# Patient Record
Sex: Female | Born: 1960 | Race: White | Hispanic: No | Marital: Married | Smoking: Never smoker
Health system: Southern US, Community
[De-identification: ages and names within clinical notes are randomized; demographics above are authoritative.]

## PROBLEM LIST (undated history)

## (undated) DIAGNOSIS — K219 Gastro-esophageal reflux disease without esophagitis: Secondary | ICD-10-CM

## (undated) DIAGNOSIS — E039 Hypothyroidism, unspecified: Secondary | ICD-10-CM

## (undated) HISTORY — DX: Hypothyroidism, unspecified: E03.9

## (undated) HISTORY — PX: TONSILLECTOMY: SUR1361

## (undated) HISTORY — DX: Gastro-esophageal reflux disease without esophagitis: K21.9

---

## 1996-04-28 HISTORY — PX: OVARY SURGERY: SHX727

## 1998-05-30 ENCOUNTER — Other Ambulatory Visit: Admission: RE | Admit: 1998-05-30 | Discharge: 1998-05-30 | Payer: Self-pay | Admitting: Gynecology

## 1998-06-08 ENCOUNTER — Encounter: Payer: Self-pay | Admitting: Gynecology

## 1998-06-08 ENCOUNTER — Ambulatory Visit (HOSPITAL_COMMUNITY): Admission: RE | Admit: 1998-06-08 | Discharge: 1998-06-08 | Payer: Self-pay | Admitting: Gynecology

## 1998-06-19 ENCOUNTER — Ambulatory Visit (HOSPITAL_COMMUNITY): Admission: RE | Admit: 1998-06-19 | Discharge: 1998-06-19 | Payer: Self-pay | Admitting: Gastroenterology

## 1998-06-19 ENCOUNTER — Encounter: Payer: Self-pay | Admitting: Gastroenterology

## 1999-06-03 ENCOUNTER — Other Ambulatory Visit: Admission: RE | Admit: 1999-06-03 | Discharge: 1999-06-03 | Payer: Self-pay | Admitting: Gynecology

## 1999-06-13 ENCOUNTER — Encounter: Payer: Self-pay | Admitting: Gynecology

## 1999-06-13 ENCOUNTER — Encounter: Admission: RE | Admit: 1999-06-13 | Discharge: 1999-06-13 | Payer: Self-pay | Admitting: Gynecology

## 1999-06-18 ENCOUNTER — Encounter: Payer: Self-pay | Admitting: Gynecology

## 1999-06-18 ENCOUNTER — Encounter: Admission: RE | Admit: 1999-06-18 | Discharge: 1999-06-18 | Payer: Self-pay | Admitting: Gynecology

## 1999-11-27 ENCOUNTER — Other Ambulatory Visit: Admission: RE | Admit: 1999-11-27 | Discharge: 1999-11-27 | Payer: Self-pay | Admitting: Gynecology

## 2000-01-02 ENCOUNTER — Encounter: Payer: Self-pay | Admitting: Gastroenterology

## 2000-01-02 ENCOUNTER — Ambulatory Visit (HOSPITAL_COMMUNITY): Admission: RE | Admit: 2000-01-02 | Discharge: 2000-01-02 | Payer: Self-pay | Admitting: Gastroenterology

## 2000-06-01 ENCOUNTER — Other Ambulatory Visit: Admission: RE | Admit: 2000-06-01 | Discharge: 2000-06-01 | Payer: Self-pay | Admitting: Gynecology

## 2000-06-18 ENCOUNTER — Encounter: Payer: Self-pay | Admitting: Gynecology

## 2000-06-18 ENCOUNTER — Encounter: Admission: RE | Admit: 2000-06-18 | Discharge: 2000-06-18 | Payer: Self-pay | Admitting: Gynecology

## 2000-12-21 ENCOUNTER — Other Ambulatory Visit: Admission: RE | Admit: 2000-12-21 | Discharge: 2000-12-21 | Payer: Self-pay | Admitting: Gynecology

## 2001-03-15 ENCOUNTER — Ambulatory Visit (HOSPITAL_COMMUNITY): Admission: RE | Admit: 2001-03-15 | Discharge: 2001-03-15 | Payer: Self-pay | Admitting: Family Medicine

## 2001-03-15 ENCOUNTER — Encounter: Payer: Self-pay | Admitting: Family Medicine

## 2001-05-18 ENCOUNTER — Ambulatory Visit (HOSPITAL_COMMUNITY): Admission: RE | Admit: 2001-05-18 | Discharge: 2001-05-18 | Payer: Self-pay | Admitting: Gastroenterology

## 2001-11-24 ENCOUNTER — Encounter: Admission: RE | Admit: 2001-11-24 | Discharge: 2001-11-24 | Payer: Self-pay | Admitting: Oncology

## 2001-11-24 ENCOUNTER — Encounter (HOSPITAL_COMMUNITY): Admission: RE | Admit: 2001-11-24 | Discharge: 2001-12-24 | Payer: Self-pay | Admitting: Oncology

## 2001-12-06 ENCOUNTER — Other Ambulatory Visit: Admission: RE | Admit: 2001-12-06 | Discharge: 2001-12-06 | Payer: Self-pay | Admitting: Gynecology

## 2001-12-30 ENCOUNTER — Encounter (HOSPITAL_COMMUNITY): Admission: RE | Admit: 2001-12-30 | Discharge: 2002-01-29 | Payer: Self-pay | Admitting: Rheumatology

## 2002-02-24 ENCOUNTER — Encounter (HOSPITAL_COMMUNITY): Admission: RE | Admit: 2002-02-24 | Discharge: 2002-03-26 | Payer: Self-pay | Admitting: Rheumatology

## 2002-03-28 ENCOUNTER — Ambulatory Visit (HOSPITAL_COMMUNITY): Admission: RE | Admit: 2002-03-28 | Discharge: 2002-03-28 | Payer: Self-pay | Admitting: Gastroenterology

## 2002-03-28 ENCOUNTER — Encounter: Payer: Self-pay | Admitting: Gastroenterology

## 2004-02-19 ENCOUNTER — Encounter: Admission: RE | Admit: 2004-02-19 | Discharge: 2004-02-19 | Payer: Self-pay | Admitting: Gynecology

## 2004-06-10 ENCOUNTER — Other Ambulatory Visit: Admission: RE | Admit: 2004-06-10 | Discharge: 2004-06-10 | Payer: Self-pay | Admitting: Gynecology

## 2005-03-03 ENCOUNTER — Encounter: Admission: RE | Admit: 2005-03-03 | Discharge: 2005-03-03 | Payer: Self-pay | Admitting: Gynecology

## 2005-06-16 ENCOUNTER — Other Ambulatory Visit: Admission: RE | Admit: 2005-06-16 | Discharge: 2005-06-16 | Payer: Self-pay | Admitting: Gynecology

## 2005-08-14 ENCOUNTER — Ambulatory Visit (HOSPITAL_COMMUNITY): Admission: RE | Admit: 2005-08-14 | Discharge: 2005-08-14 | Payer: Self-pay | Admitting: Family Medicine

## 2005-10-27 ENCOUNTER — Encounter: Admission: RE | Admit: 2005-10-27 | Discharge: 2005-10-27 | Payer: Self-pay | Admitting: Gastroenterology

## 2005-11-07 ENCOUNTER — Encounter: Admission: RE | Admit: 2005-11-07 | Discharge: 2005-11-07 | Payer: Self-pay | Admitting: Gastroenterology

## 2006-03-16 ENCOUNTER — Encounter: Admission: RE | Admit: 2006-03-16 | Discharge: 2006-03-16 | Payer: Self-pay | Admitting: Gynecology

## 2006-06-22 ENCOUNTER — Other Ambulatory Visit: Admission: RE | Admit: 2006-06-22 | Discharge: 2006-06-22 | Payer: Self-pay | Admitting: Gynecology

## 2007-03-22 ENCOUNTER — Encounter: Admission: RE | Admit: 2007-03-22 | Discharge: 2007-03-22 | Payer: Self-pay | Admitting: Gynecology

## 2007-11-17 ENCOUNTER — Ambulatory Visit (HOSPITAL_COMMUNITY): Admission: RE | Admit: 2007-11-17 | Discharge: 2007-11-17 | Payer: Self-pay | Admitting: Family Medicine

## 2008-03-22 ENCOUNTER — Encounter: Admission: RE | Admit: 2008-03-22 | Discharge: 2008-03-22 | Payer: Self-pay | Admitting: Gynecology

## 2009-03-26 ENCOUNTER — Encounter: Admission: RE | Admit: 2009-03-26 | Discharge: 2009-03-26 | Payer: Self-pay | Admitting: Gynecology

## 2009-04-02 ENCOUNTER — Encounter: Admission: RE | Admit: 2009-04-02 | Discharge: 2009-04-02 | Payer: Self-pay | Admitting: Gynecology

## 2009-04-16 ENCOUNTER — Encounter: Admission: RE | Admit: 2009-04-16 | Discharge: 2009-04-16 | Payer: Self-pay | Admitting: Neurology

## 2010-02-25 ENCOUNTER — Ambulatory Visit (HOSPITAL_COMMUNITY): Admission: RE | Admit: 2010-02-25 | Discharge: 2010-02-25 | Payer: Self-pay | Admitting: Internal Medicine

## 2010-03-27 ENCOUNTER — Encounter: Admission: RE | Admit: 2010-03-27 | Discharge: 2010-03-27 | Payer: Self-pay | Admitting: Gynecology

## 2010-05-19 ENCOUNTER — Encounter: Payer: Self-pay | Admitting: Gynecology

## 2010-07-10 ENCOUNTER — Other Ambulatory Visit (HOSPITAL_COMMUNITY): Payer: Self-pay | Admitting: Family Medicine

## 2010-07-10 DIAGNOSIS — R110 Nausea: Secondary | ICD-10-CM

## 2010-07-10 DIAGNOSIS — M549 Dorsalgia, unspecified: Secondary | ICD-10-CM

## 2010-07-15 ENCOUNTER — Ambulatory Visit (HOSPITAL_COMMUNITY): Payer: BC Managed Care – PPO

## 2010-09-13 NOTE — Consult Note (Signed)
NAME:  Tracie, Ross NO.:  [id number]   MEDICAL RECORD NO.:  [id number]                   PATIENT TYPE:   LOCATION:                                       FACILITY:   PHYSICIAN:  Aundra Dubin, M.D.            DATE OF BIRTH:   DATE OF CONSULTATION:  02/24/2002  DATE OF DISCHARGE:                                   CONSULTATION   CHIEF COMPLAINT:  Left elbow and shoulder, probable fibromyalgia.   BRIEF HISTORY:  Tracie Ross returns, reporting that Tracie Ross is sleeping some  better with the use of Xanax in combination with her Elavil.  Her overall  achiness is about the same, but it is not worse. Tracie Ross is having fairly rare  headaches at this time, and the irritable bowel symptoms are reasonably  controlled.  Her worst area of pain at this time is the left lateral elbow.  This hurts when Tracie Ross lifts items; Tracie Ross is also stiff and hurting with the left  shoulder.  This has been going on for several weeks.   MEDICATIONS:  1. Xanax 0.5 mg h.s.  2. Celebrex 200 mg b.i.d.  3. Nexium 40 mg q.d.  4. Elavil 50 mg h.s.  5. Claritin-D q.d.  6. Fioricet p.r.n.  7. Singulair q.d.   PHYSICAL EXAMINATION:  VITAL SIGNS:  Weight 127 lbs.  Blood pressure 100/70,  respirations 16.  GENERAL:  Tracie Ross appears well.  LUNGS:  Clear.  HEART:  Regular.  LOWER EXTREMITIES:  No edema.  MUSCULOSKELETAL:  There is no swelling to the hands or wrists.  Tracie Ross is quite  tender at the left lateral epicondyle; resisted maneuvers make this worse.  The left shoulder has a good range of motion but is mildly tender.  Tracie Ross  could abduct easily to 150 degrees or beyond.  Trigger points around the  shoulder, neck, ____________ anterior chest remains mildly tender.   ASSESSMENT:  1. Fibromyalgia.  Tracie Ross seems to be overall stable with this problem.  Her     pain is not accelerated, and Tracie Ross is sleeping some better.  I believe this     is a problem that is stable, and I can return her care to Dr.  Sherwood Gambler.  2. Left lateral epicondylitis.  I have explained to her how Tracie Ross will ice the     elbow once a day for 15 minutes for a one-week period.  Then, Tracie Ross will     ice it every other day for about two weeks, further.  Tracie Ross will put the     wrist and elbow through a range of motion.  Tracie Ross is already taking     Celebrex, and I have given her a prescription for a     tennis elbow strap.  If this is not helping, then I would consider a     Cortisone injection.  Tracie Ross will also try to avoid aggravating activities.  PLAN:  Tracie Ross will return on a p.r.n. basis.                                               Aundra Dubin, M.D.    WWT/MEDQ  D:  02/24/2002  T:  02/24/2002  Job:  [id number]   cc:   Madelin Rear. Sherwood Gambler, M.D.  [address]  [place]  [place] [address]  Fax: [phone]

## 2010-09-13 NOTE — Consult Note (Signed)
NAME:  Tracie Ross, Tracie Ross NO.:  [id number]   MEDICAL RECORD NO.:  [id number]                  PATIENT TYPE:   LOCATION:                                       FACILITY:   PHYSICIAN:  Aundra Dubin, M.D.            DATE OF BIRTH:   DATE OF CONSULTATION:  12/30/2001  DATE OF DISCHARGE:                                   CONSULTATION   Thank you for this consultation.  The patient is a 50 year old white female  who is here for an evaluation of possible fibromyalgia.  She has recently  been evaluated by Dr. Mariel Sleet because of easy bruisability which has been  suspected to be related to Celebrex.  For about two and a half years she has  had some wide spread pain.  She reports having a dull ache all over.  She  also describes this as flu like achiness.  Her elbows ache and are swollen.  She hurts at the tops of the shoulders.  Her neck will hurt and this causes  a headache.  When she wakes up in the morning she feels unrested, tired, and  sore.  She feels as if she has run a marathon.  Her energy level is quite  low.  She has had an alternating pattern of diarrhea and constipation for  several years and this has been diagnosed as irritable bowel syndrome.   On further review of systems there has been no fever or significant rashes  other than the bruising.  She reports having increase in weight by about 25  pounds over a year.  She does not feel that the Elavil is greatly involved  with this as she has been on it for about two months.  There has been no  photosensitivity, oral ulcers, alopecia, or pleurisy.  She states cold  fairly frequently and she will ache at night because of this.  There has  been no blanching of her fingers to suggest Raynaud's.  She has some back  pain.  There has been no chest pain or shortness of breath.   PAST MEDICAL HISTORY:  History of fibromyalgia, GERD, tonsillectomy, left  inguinal hernia repair 1964, vaginal pelvic  surgery 1982, colonoscopy 2001,  endoscopy 1998 and 2003, irritable bowel syndrome.   MEDICATIONS:  1. Celebrex 200 mg b.i.d.  2. Nexium 40 mg q.d.  3. Elavil 50 mg h.s.  4. Xanax presently not taking.  5. Claritin D p.r.n.  6. Singulair q.d.  7. Fioricet p.r.n. for headaches.   ALLERGIES:  Intolerances:  ASPIRIN, CEFTIN, CIPRO.   FAMILY HISTORY:  Her father is 7 years of age.  He has had a stroke and has  HTN.  Her mother is 40 years of age and has breast cancer.  She is doing  well.   SOCIAL HISTORY:  She grew up in [place] and presently lives in [place] with her  husband.  She does  not have children.  She is not working at this time.  She  never smoked and does not drink alcohol.   PHYSICAL EXAMINATION:  VITAL SIGNS:  Weight 126 pounds, blood pressure  110/68, respirations 14.  GENERAL:  She is appropriate weight and is in no distress.  SKIN:  I find no significant bruising at this time or other rashes.  HEENT:  Normal hair pattern.  PERRL/EOMI.  Mouth clear.  NECK:  Moderate increased thyroid that is symmetric and nontender.  LUNGS:  Clear.  HEART:  Regular.  No murmur.  ABDOMEN:  Negative  HSM with diffuse mild tenderness.  MUSCULOSKELETAL:  The hands, wrists, elbows, shoulders, neck, back, hips,  knees, ankles, and feet all have a painless full range of motion and show no  synovitis.  Trigger points at the elbows, shoulder, neck, occiput, anterior  chest, and along the paraspinous muscles to the low back are tender with  complaint of pain and some moderate wincing.  NEUROLOGIC:  Strength is 5/5.  DTRs are 2+ throughout.  Negative SLRs.   ASSESSMENT/PLAN:  1. Likely fibromyalgia type syndrome.  She has the cardinal symptoms of     nonrestorative sleep, fatigue, overall aching, pain, irritable bowel     syndrome, and headaches.  I have discussed these symptoms with her.  We     will try and improve her sleep by adding Xanax 0.5 mg to her Elavil.  If     she continues to  gain weight, then we may want to substitute something     for the Elavil.  I discussed with her that she is at appropriate weight     at this point but I am hopeful that she will not continue gaining 1 and 2     pounds each month.  I have also recommended exercise for her.  She says     her husband is quite active and backpacks.  She is interested in     bicycling which would be fine.  I have encouraged her to start with a     small amount with a goal over six weeks to be able to exercise for 30-40     minutes three times a week.  Spent extra time discussing this with her.  2. Irritable bowel syndrome.  3. Recent easy bruising.  It seems to be improved at this time but she is     going to take the Celebrex for a while and see if it comes back.   Peyton Najjar, as above, I believe the patient meets the criteria for a fibromyalgia  type syndrome.  I am hopeful we can improve her sleep with the medicines.  I  also believe that if she will start exercising this will also increase her  energy level and hopefully get her feeling better.  I will see her back in  two months.                                               Aundra Dubin, M.D.    WWT/MEDQ  D:  12/30/2001  T:  12/31/2001  Job:  [id number]   cc:   Madelin Rear. Sherwood Gambler, M.D.  [address]  [place]  [place] [address]  Fax: [phone]

## 2010-09-13 NOTE — Procedures (Signed)
[facility]  Patient:    Tracie Ross, Tracie Ross Visit Number: [id number] MRN: [id number]          Service Type: END Location: ENDO Attending Physician:  Louie Bun Dictated by:   Everardo All Madilyn Fireman, M.D. Proc. Date: 05/18/01 Admit Date:  05/18/2001                             Procedure Report  PROCEDURE:  Esophagogastroduodenoscopy with esophageal dilatation.  INDICATIONS FOR PROCEDURE:  Recurrent solid food dysphagia with good response to esophageal dilatation in the past.  DESCRIPTION OF PROCEDURE:  The patient was placed in the left lateral decubitus position then placed on the pulse monitor with continuous low flow oxygen delivered by nasal cannula. She was sedated with 70 mg IV Demerol and 7 mg IV Versed. The Olympus video endoscope was advanced under direct vision into the oropharynx and esophagus. The esophagus was straight and of normal caliber at the squamocolumnar line at 38 cm. There was no visible esophagitis, ring, stricture, hiatal hernia or other abnormality of the GE junction or distal esophagus. The stomach was entered and a small amount of liquid secretions were suctioned from the fundus. Retroflexed view of the cardia was unremarkable. The fundus, body, antrum, and pylorus all appeared normal. The duodenum was entered and both the bulb and second portion were well inspected and appeared to be within normal limits. A Savary guidewire was placed through the endoscope channel and the scope withdrawn. A single 16 mm Savary dilator was passed over the guidewire with mild resistance and no blood seen on withdrawal. The dilator was removed together with the wire and the patient returned to the recovery room in stable condition. The patient tolerated the procedure well and there were no immediate complications.  IMPRESSION:  Dysphagia status post empiric Savary dilatation with normal findings on endoscopy.  PLAN:  Will advance diet and  observe response to dilatation. Dictated by:   Everardo All Madilyn Fireman, M.D. Attending Physician:  Louie Bun DD:  05/18/01 TD:  05/19/01 Job: [id number]

## 2011-02-17 ENCOUNTER — Other Ambulatory Visit: Payer: Self-pay | Admitting: Gynecology

## 2011-02-17 DIAGNOSIS — Z1231 Encounter for screening mammogram for malignant neoplasm of breast: Secondary | ICD-10-CM

## 2011-03-31 ENCOUNTER — Ambulatory Visit
Admission: RE | Admit: 2011-03-31 | Discharge: 2011-03-31 | Disposition: A | Discharge status: Home / Self Care | Payer: BC Managed Care – PPO | Source: Ambulatory Visit | Attending: Gynecology | Admitting: Gynecology

## 2011-03-31 DIAGNOSIS — Z1231 Encounter for screening mammogram for malignant neoplasm of breast: Secondary | ICD-10-CM

## 2012-02-24 ENCOUNTER — Other Ambulatory Visit: Payer: Self-pay | Admitting: Gynecology

## 2012-02-24 DIAGNOSIS — Z1231 Encounter for screening mammogram for malignant neoplasm of breast: Secondary | ICD-10-CM

## 2012-04-05 ENCOUNTER — Ambulatory Visit
Admission: RE | Admit: 2012-04-05 | Discharge: 2012-04-05 | Disposition: A | Discharge status: Home / Self Care | Payer: BC Managed Care – PPO | Source: Ambulatory Visit | Attending: Gynecology | Admitting: Gynecology

## 2012-04-05 DIAGNOSIS — Z1231 Encounter for screening mammogram for malignant neoplasm of breast: Secondary | ICD-10-CM

## 2013-03-07 ENCOUNTER — Other Ambulatory Visit: Payer: Self-pay

## 2013-03-07 DIAGNOSIS — Z1231 Encounter for screening mammogram for malignant neoplasm of breast: Secondary | ICD-10-CM

## 2013-04-11 ENCOUNTER — Ambulatory Visit: Payer: BC Managed Care – PPO

## 2013-04-22 ENCOUNTER — Ambulatory Visit
Admission: RE | Admit: 2013-04-22 | Discharge: 2013-04-22 | Disposition: A | Discharge status: Home / Self Care | Payer: BC Managed Care – PPO | Source: Ambulatory Visit

## 2013-04-22 DIAGNOSIS — Z1231 Encounter for screening mammogram for malignant neoplasm of breast: Secondary | ICD-10-CM

## 2014-03-02 ENCOUNTER — Other Ambulatory Visit: Payer: Self-pay

## 2014-03-02 DIAGNOSIS — Z1231 Encounter for screening mammogram for malignant neoplasm of breast: Secondary | ICD-10-CM

## 2014-04-24 ENCOUNTER — Encounter (INDEPENDENT_AMBULATORY_CARE_PROVIDER_SITE_OTHER): Payer: Self-pay

## 2014-04-24 ENCOUNTER — Ambulatory Visit
Admission: RE | Admit: 2014-04-24 | Discharge: 2014-04-24 | Disposition: A | Discharge status: Home / Self Care | Payer: BC Managed Care – PPO | Source: Ambulatory Visit

## 2014-04-24 DIAGNOSIS — Z1231 Encounter for screening mammogram for malignant neoplasm of breast: Secondary | ICD-10-CM

## 2014-12-26 ENCOUNTER — Ambulatory Visit (HOSPITAL_COMMUNITY)
Admission: RE | Admit: 2014-12-26 | Discharge: 2014-12-26 | Disposition: A | Discharge status: Home / Self Care | Payer: BLUE CROSS/BLUE SHIELD | Source: Ambulatory Visit | Attending: Family Medicine | Admitting: Family Medicine

## 2014-12-26 ENCOUNTER — Other Ambulatory Visit (HOSPITAL_COMMUNITY): Payer: Self-pay | Admitting: Family Medicine

## 2014-12-26 DIAGNOSIS — M5031 Other cervical disc degeneration,  high cervical region: Secondary | ICD-10-CM | POA: Diagnosis not present

## 2014-12-26 DIAGNOSIS — R51 Headache: Secondary | ICD-10-CM | POA: Diagnosis not present

## 2014-12-26 DIAGNOSIS — M79601 Pain in right arm: Secondary | ICD-10-CM | POA: Diagnosis present

## 2014-12-26 DIAGNOSIS — M8938 Hypertrophy of bone, other site: Secondary | ICD-10-CM | POA: Insufficient documentation

## 2014-12-26 DIAGNOSIS — R202 Paresthesia of skin: Secondary | ICD-10-CM | POA: Insufficient documentation

## 2014-12-26 DIAGNOSIS — M542 Cervicalgia: Secondary | ICD-10-CM

## 2015-01-17 ENCOUNTER — Other Ambulatory Visit (HOSPITAL_COMMUNITY): Payer: Self-pay | Admitting: Family Medicine

## 2015-01-17 DIAGNOSIS — M5410 Radiculopathy, site unspecified: Secondary | ICD-10-CM

## 2015-01-17 DIAGNOSIS — M542 Cervicalgia: Secondary | ICD-10-CM

## 2015-01-17 DIAGNOSIS — G5691 Unspecified mononeuropathy of right upper limb: Secondary | ICD-10-CM

## 2015-01-29 ENCOUNTER — Ambulatory Visit (HOSPITAL_COMMUNITY)
Admission: RE | Admit: 2015-01-29 | Discharge: 2015-01-29 | Disposition: A | Discharge status: Home / Self Care | Payer: BLUE CROSS/BLUE SHIELD | Source: Ambulatory Visit | Attending: Family Medicine | Admitting: Family Medicine

## 2015-01-29 DIAGNOSIS — M542 Cervicalgia: Secondary | ICD-10-CM

## 2015-01-29 DIAGNOSIS — M47892 Other spondylosis, cervical region: Secondary | ICD-10-CM | POA: Insufficient documentation

## 2015-01-29 DIAGNOSIS — G5691 Unspecified mononeuropathy of right upper limb: Secondary | ICD-10-CM

## 2015-01-29 DIAGNOSIS — M5410 Radiculopathy, site unspecified: Secondary | ICD-10-CM

## 2015-01-29 DIAGNOSIS — R200 Anesthesia of skin: Secondary | ICD-10-CM | POA: Diagnosis not present

## 2015-04-03 ENCOUNTER — Other Ambulatory Visit: Payer: Self-pay

## 2015-04-03 DIAGNOSIS — Z1231 Encounter for screening mammogram for malignant neoplasm of breast: Secondary | ICD-10-CM

## 2015-05-01 ENCOUNTER — Ambulatory Visit: Payer: BLUE CROSS/BLUE SHIELD

## 2015-05-04 ENCOUNTER — Ambulatory Visit
Admission: RE | Admit: 2015-05-04 | Discharge: 2015-05-04 | Disposition: A | Discharge status: Home / Self Care | Payer: BLUE CROSS/BLUE SHIELD | Source: Ambulatory Visit

## 2015-05-04 DIAGNOSIS — Z1231 Encounter for screening mammogram for malignant neoplasm of breast: Secondary | ICD-10-CM

## 2015-09-20 ENCOUNTER — Other Ambulatory Visit: Payer: Self-pay | Admitting: Nurse Practitioner

## 2015-09-20 DIAGNOSIS — M4712 Other spondylosis with myelopathy, cervical region: Secondary | ICD-10-CM

## 2015-09-21 NOTE — Progress Notes (Signed)
Pt chart indicates she had a rash and was treated with 25 mg of benadryl and was fine. Called an cancelled 13 hour prep after talking to pt.

## 2015-10-01 ENCOUNTER — Ambulatory Visit
Admission: RE | Admit: 2015-10-01 | Discharge: 2015-10-01 | Disposition: A | Discharge status: Home / Self Care | Payer: BLUE CROSS/BLUE SHIELD | Source: Ambulatory Visit | Attending: Nurse Practitioner | Admitting: Nurse Practitioner

## 2015-10-01 DIAGNOSIS — M4712 Other spondylosis with myelopathy, cervical region: Secondary | ICD-10-CM

## 2015-10-01 MED ORDER — IOPAMIDOL (ISOVUE-300) INJECTION 61%
1.0000 mL | Freq: Once | INTRAVENOUS | Status: AC | PRN
  Administered 2015-10-01: 1 mL

## 2015-10-01 MED ORDER — TRIAMCINOLONE ACETONIDE 40 MG/ML IJ SUSP (RADIOLOGY)
60.0000 mg | Freq: Once | INTRAMUSCULAR | Status: AC
  Administered 2015-10-01: 60 mg via EPIDURAL

## 2015-10-01 NOTE — Discharge Instructions (Signed)

## 2016-02-08 ENCOUNTER — Other Ambulatory Visit: Payer: Self-pay | Admitting: Neurosurgery

## 2016-02-08 DIAGNOSIS — M4712 Other spondylosis with myelopathy, cervical region: Secondary | ICD-10-CM

## 2016-02-19 ENCOUNTER — Ambulatory Visit
Admission: RE | Admit: 2016-02-19 | Discharge: 2016-02-19 | Disposition: A | Discharge status: Home / Self Care | Payer: BLUE CROSS/BLUE SHIELD | Source: Ambulatory Visit | Attending: Neurosurgery | Admitting: Neurosurgery

## 2016-02-19 DIAGNOSIS — M4712 Other spondylosis with myelopathy, cervical region: Secondary | ICD-10-CM

## 2016-02-19 MED ORDER — IOPAMIDOL (ISOVUE-M 300) INJECTION 61%
1.0000 mL | Freq: Once | INTRAMUSCULAR | Status: AC | PRN
  Administered 2016-02-19: 1 mL via EPIDURAL

## 2016-02-19 MED ORDER — TRIAMCINOLONE ACETONIDE 40 MG/ML IJ SUSP (RADIOLOGY)
60.0000 mg | Freq: Once | INTRAMUSCULAR | Status: AC
  Administered 2016-02-19: 60 mg via EPIDURAL

## 2016-02-19 NOTE — Discharge Instructions (Signed)

## 2016-02-20 ENCOUNTER — Inpatient Hospital Stay: Admission: RE | Admit: 2016-02-20 | Payer: BLUE CROSS/BLUE SHIELD | Source: Ambulatory Visit

## 2016-03-25 ENCOUNTER — Other Ambulatory Visit: Payer: Self-pay | Admitting: Neurosurgery

## 2016-03-25 DIAGNOSIS — M4722 Other spondylosis with radiculopathy, cervical region: Secondary | ICD-10-CM

## 2016-04-04 ENCOUNTER — Other Ambulatory Visit: Payer: BLUE CROSS/BLUE SHIELD

## 2016-04-15 ENCOUNTER — Ambulatory Visit
Admission: RE | Admit: 2016-04-15 | Discharge: 2016-04-15 | Disposition: A | Discharge status: Home / Self Care | Payer: BLUE CROSS/BLUE SHIELD | Source: Ambulatory Visit | Attending: Neurosurgery | Admitting: Neurosurgery

## 2016-04-15 DIAGNOSIS — M4722 Other spondylosis with radiculopathy, cervical region: Secondary | ICD-10-CM

## 2016-05-21 ENCOUNTER — Ambulatory Visit
Admission: RE | Admit: 2016-05-21 | Discharge: 2016-05-21 | Disposition: A | Discharge status: Home / Self Care | Payer: BLUE CROSS/BLUE SHIELD | Source: Ambulatory Visit | Attending: Family Medicine | Admitting: Family Medicine

## 2016-05-21 ENCOUNTER — Other Ambulatory Visit: Payer: Self-pay | Admitting: Family Medicine

## 2016-05-21 DIAGNOSIS — Z1231 Encounter for screening mammogram for malignant neoplasm of breast: Secondary | ICD-10-CM

## 2016-08-07 HISTORY — PX: CERVICAL FUSION: SHX112

## 2016-10-20 IMAGING — MR MR CERVICAL SPINE W/O CM
4 of 5 series · 14 of 48 positions shown · non-contrast
Comparison: 04/16/2009

CLINICAL DATA: Neck pain with right arm numbness, 3 months duration

EXAM:
MRI CERVICAL SPINE WITHOUT CONTRAST
TECHNIQUE: Multiplanar, multisequence MR imaging of the cervical spine was
performed. No intravenous contrast was administered.

[Series 4: T2 · sagittal · 3.0mm · 0.33mm/px · 5 of 13 slices shown (1 of 2)]
[im 1/13]
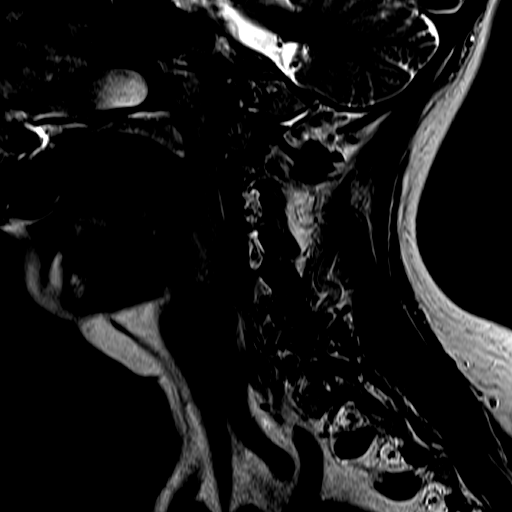
[im 3/13]
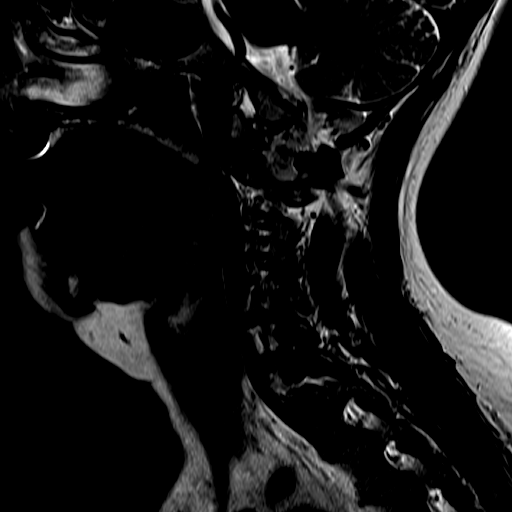
[im 5/13]
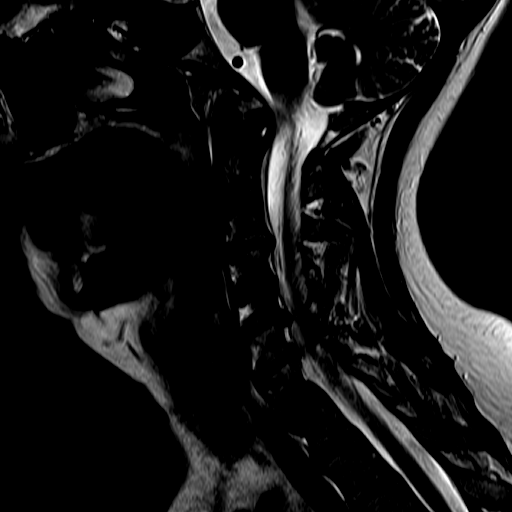
[im 8/13]
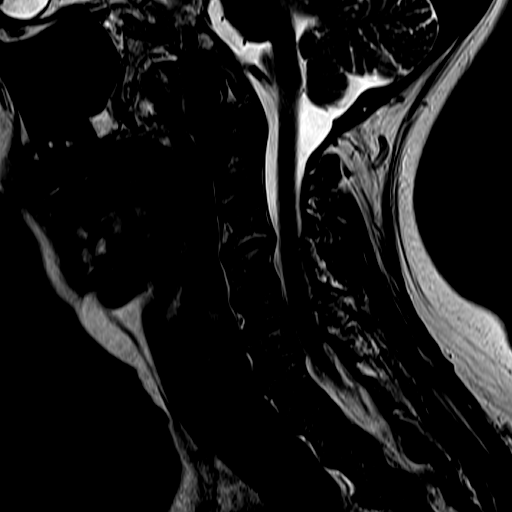
[im 13/13]
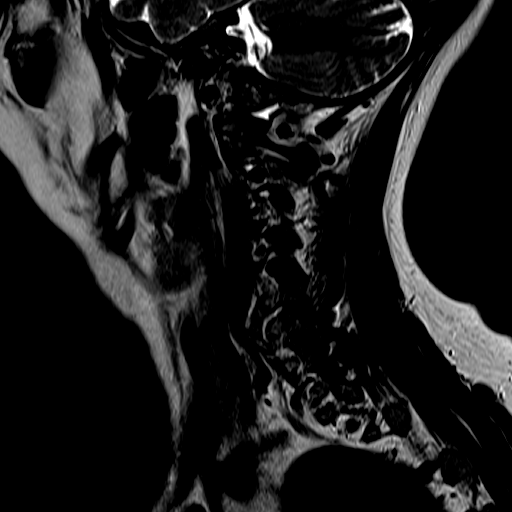

[Series 5: FLAIR · sagittal · 3.0mm · 0.37mm/px · 3 of 13 slices shown]
[im 3/13]
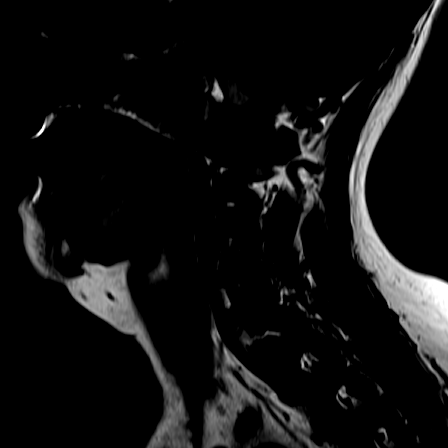
[im 7/13]
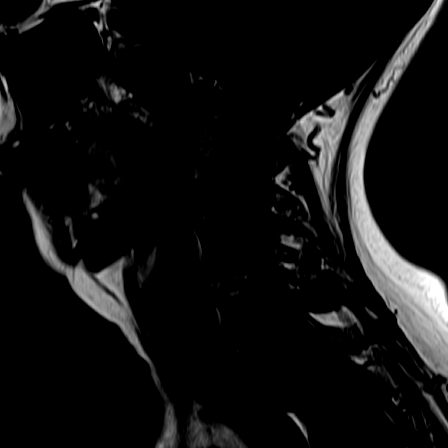
[im 11/13]
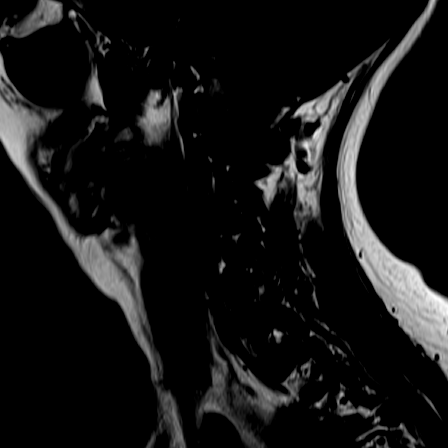

[Series 6: ir sagital · sagittal · 3.0mm · 0.19mm/px · 3 of 13 slices shown]
[im 3/13]
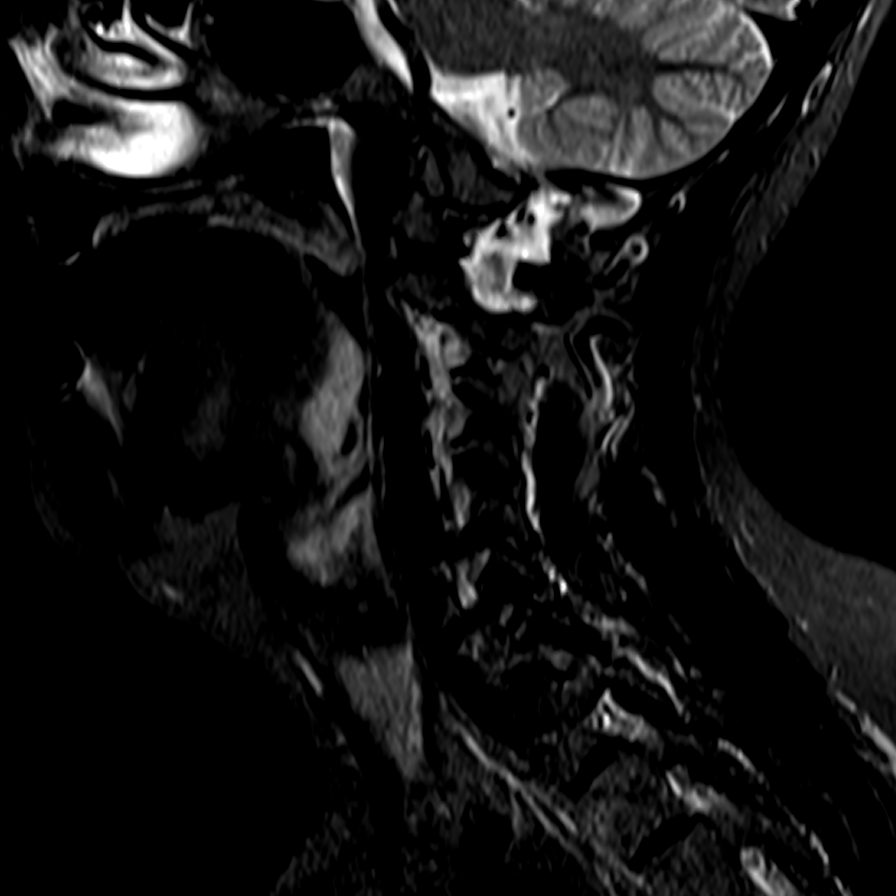
[im 7/13]
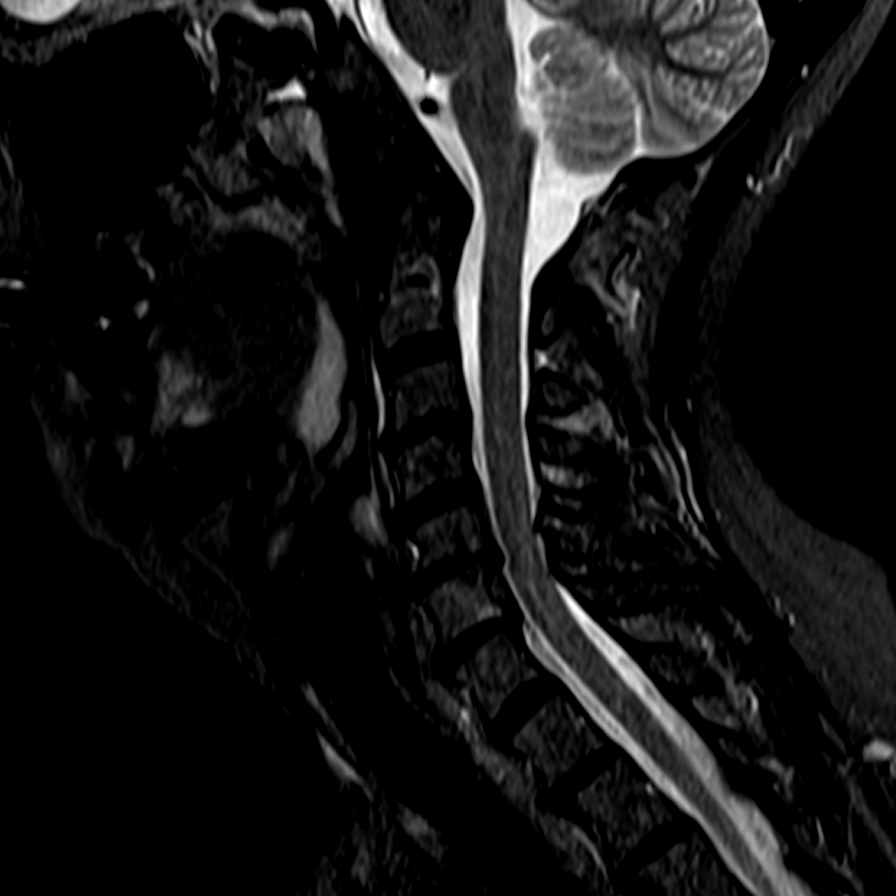
[im 11/13]
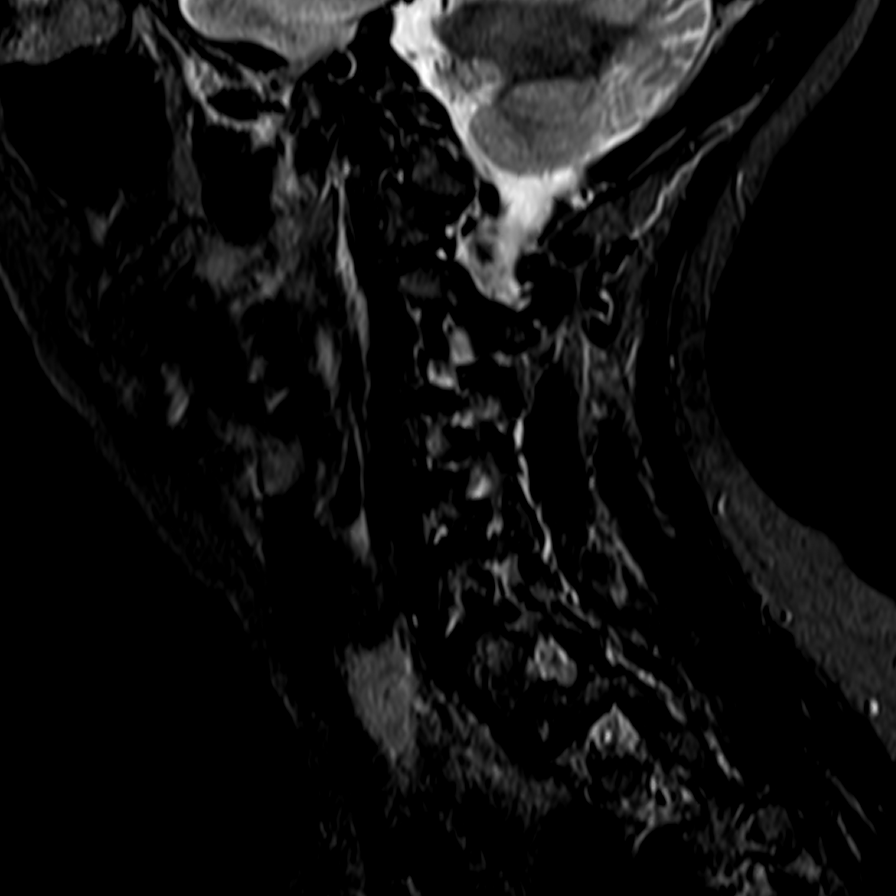

[Series 8: T2 · axial · 3.0mm · 0.21mm/px · z∈[-58,-0]mm · 3 of 28 slices shown (2 of 2)]
[im 5/28]
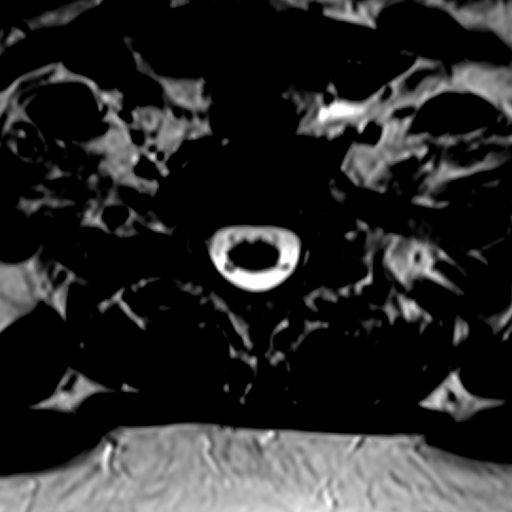
[im 15/28]
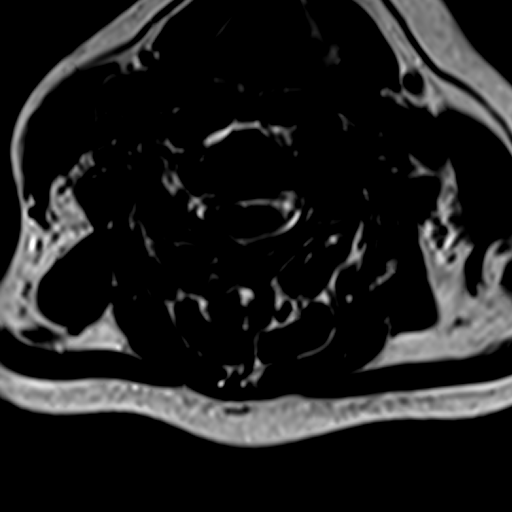
[im 23/28]
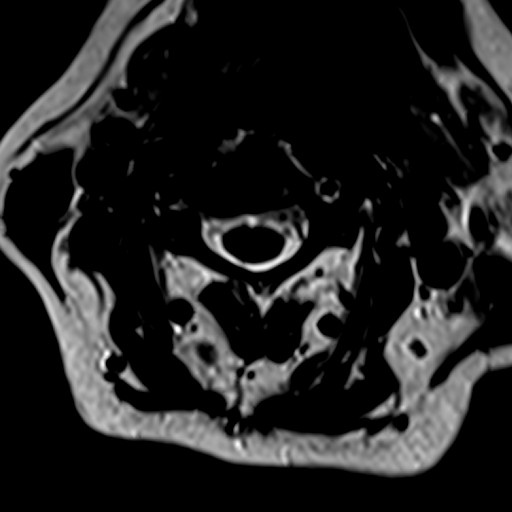

[14 of 48 positions shown; findings below may reference images not displayed]

FINDINGS: Foramen magnum is widely patent. There is ordinary osteoarthritis at
the C1-2 articulation without encroachment upon the neural spaces.

C2-3:  Normal interspace.

C3-4: Spondylosis with endplate osteophytes and shallow protrusion
of disc material. Mild narrowing of the ventral subarachnoid space
but no compressive effect upon the cord. Foraminal encroachment
bilaterally that could affect either C4 nerve root.

C4-5:  Mild bulging of the disc.  No stenosis.

C5-6: Spondylosis with endplate osteophytes and shallow protrusion
of disc material. Effacement of the ventral subarachnoid space but
no compression of the cord. Foraminal encroachment bilaterally .
Either C6 nerve root could be affected.

C6-7: Spondylosis with endplate osteophytes and bulging of the disc.
Narrowing of the ventral subarachnoid space but no compression of
the cord. Foraminal encroachment bilaterally that could affect
either C7 nerve root.

C7-T1: Mild facet degeneration.  No stenosis.
IMPRESSION: Degenerative spondylosis at C3-4, C5-6 and C6-7 with foraminal
encroachment bilaterally at those levels that could affect either or
both C4 nerve roots, C6 nerve roots or C7 nerve roots.

Compared to the previous study, as expected the degenerative changes
are progressive since [DATE].

## 2016-12-22 ENCOUNTER — Ambulatory Visit (INDEPENDENT_AMBULATORY_CARE_PROVIDER_SITE_OTHER): Payer: BLUE CROSS/BLUE SHIELD | Admitting: Cardiovascular Disease

## 2016-12-22 ENCOUNTER — Encounter: Payer: Self-pay | Admitting: Cardiovascular Disease

## 2016-12-22 VITALS — BP 100/78 | HR 95 | Ht 61.0 in | Wt 117.0 lb

## 2016-12-22 DIAGNOSIS — R002 Palpitations: Secondary | ICD-10-CM | POA: Diagnosis not present

## 2016-12-22 DIAGNOSIS — R000 Tachycardia, unspecified: Secondary | ICD-10-CM | POA: Diagnosis not present

## 2016-12-22 NOTE — Progress Notes (Signed)
CARDIOLOGY CONSULT NOTE  Patient ID: Tracie Ross MRN: [id number] DOB/AGE: 1960/06/25 56 y.o.  Admit date: (Not on file) Primary Physician: Wynn Banker, MD Referring Physician: Assunta Found MD  Reason for Consultation: chest pain and tachycardia  HPI: Tracie Ross is a 56 y.o. female who is being seen today for the evaluation of chest pain and tachycardia at the request of Assunta Found, MD.   ECG performed at PCPs office on 11/21/16 which I personally interpreted demonstrated normal sinus rhythm with no ischemic ST segment or T-wave abnormalities. Nor any arrhythmias.  Labs 11/21/16: By blood cells 8.9, hemoglobin 12.6, platelets 335, BUN 15, creatinine 0.92, sodium 139, potassium 3.9, calcium 9.7, total cholesterol 182, trig glycerides 147, HDL 52, LDL 101, TSH 0.023, normal vitamin B-12 level of 407, low vitamin D level of 20.7.  She has been tachycardic for the past 6 months. She has had some recent adjustments to her levothyroxine dosage as her TSH was low. She has had episodic chest pains. She has noticed her heart rate as fast as 130 bpm. She walks 2 miles daily and has noticed it walking the second mile. About 2-1/2 weeks ago she felt lightheaded and her heart rate was in the 130 bpm minute range. She also swims and walks on a treadmill. She has also noticed that HR spontaneously start running fast while at rest.  She has had left foot coolness more so than right and left hand tingling and coldness more so than right.  She has a prior history of GERD treated with Nexium and symptoms have resolved.   Fam Hx: Her father had an MI and had a CVA in his 32s.    Allergies  Allergen Reactions  . Iohexol Itching and Rash    Desc: Patient began to itch after receiving omnipaque 300. Was given 25 mg benadryl and felt better., Onset Date: [id number]     Current Outpatient Prescriptions  Medication Sig Dispense Refill  . amitriptyline (ELAVIL) 50 MG tablet Take  50 mg by mouth at bedtime.    . Cholecalciferol (VITAMIN D3) 50000 units CAPS Take 1 capsule by mouth once a week.    . eletriptan (RELPAX) 20 MG tablet Take 20 mg by mouth as needed for migraine or headache. May repeat in 2 hours if headache persists or recurs.    Marland Kitchen levothyroxine (SYNTHROID, LEVOTHROID) 50 MCG tablet Take 50 mcg by mouth daily.    Marland Kitchen linaclotide (LINZESS) 290 MCG CAPS capsule Take 290 mcg by mouth daily.    Marland Kitchen topiramate (TOPAMAX) 100 MG tablet Take 100 mg by mouth daily.     No current facility-administered medications for this visit.     No past medical history on file.  Past Surgical History:  Procedure Laterality Date  . CERVICAL FUSION  08/07/2016   Dr. Channing Mutters  . OVARY SURGERY  1998   mass  . TONSILLECTOMY     age 79    Social History   Social History  . Marital status: Married    Spouse name: N/A  . Number of children: N/A  . Years of education: N/A   Occupational History  . Not on file.   Social History Main Topics  . Smoking status: Never Smoker  . Smokeless tobacco: Never Used  . Alcohol use Not on file  . Drug use: Unknown  . Sexual activity: Not on file   Other Topics Concern  . Not on file   Social  History Narrative  . No narrative on file     No family history of premature CAD in 1st degree relatives.  Current Meds  Medication Sig  . amitriptyline (ELAVIL) 50 MG tablet Take 50 mg by mouth at bedtime.  . Cholecalciferol (VITAMIN D3) 50000 units CAPS Take 1 capsule by mouth once a week.  . eletriptan (RELPAX) 20 MG tablet Take 20 mg by mouth as needed for migraine or headache. May repeat in 2 hours if headache persists or recurs.  Marland Kitchen levothyroxine (SYNTHROID, LEVOTHROID) 50 MCG tablet Take 50 mcg by mouth daily.  Marland Kitchen linaclotide (LINZESS) 290 MCG CAPS capsule Take 290 mcg by mouth daily.  Marland Kitchen topiramate (TOPAMAX) 100 MG tablet Take 100 mg by mouth daily.      Review of systems complete and found to be negative unless listed above in  HPI    Physical exam Blood pressure 100/78, pulse 95, height 5\' 1"  (1.549 m), weight 117 lb (53.1 kg), SpO2 97 %. General: NAD Neck: No JVD, no thyromegaly or thyroid nodule.  Lungs: Clear to auscultation bilaterally with normal respiratory effort. CV: Nondisplaced PMI. Regular rate and rhythm, normal S1/S2, no S3/S4, no murmur.  No peripheral edema.  No carotid bruit.    Abdomen: Soft, nontender, no distention.  Skin: Intact without lesions or rashes.  Neurologic: Alert and oriented x 3.  Psych: Normal affect. Extremities: No clubbing or cyanosis.  HEENT: Normal.   ECG: Most recent ECG reviewed.   Labs: No results found for: K, BUN, CREATININE, ALT, TSH, HGB   Lipids: No results found for: LDLCALC, LDLDIRECT, CHOL, TRIG, HDL      ASSESSMENT AND PLAN:  1. Tachycardia and palpitations: Low TSH is suggestive of iatrogenic hyperthyroidism. However, it appears tachycardia and palpitations have been going on for 6 months. Unclear if this was also thyroid mediated. I will obtain an echocardiogram and 3 week event monitor to evaluate for other tachyarrhythmias.    Disposition: Follow up in 6 weeks.   Signed: Prentice Docker, M.D., F.A.C.C.  12/22/2016, 3:08 PM

## 2016-12-22 NOTE — Patient Instructions (Signed)
Medication Instructions:  Continue all current medications.  Labwork: none  Testing/Procedures:  Your physician has requested that you have an echocardiogram. Echocardiography is a painless test that uses sound waves to create images of your heart. It provides your doctor with information about the size and shape of your heart and how well your heart's chambers and valves are working. This procedure takes approximately one hour. There are no restrictions for this procedure.  Your physician has recommended that you wear a 21 day event monitor. Event monitors are medical devices that record the heart's electrical activity. Doctors most often [place] these monitors to diagnose arrhythmias. Arrhythmias are problems with the speed or rhythm of the heartbeat. The monitor is a small, portable device. You can wear one while you do your normal daily activities. This is usually used to diagnose what is causing palpitations/syncope (passing out).  Office will contact with results via phone or letter.    Follow-Up: 6 weeks   Any Other Special Instructions Will Be Listed Below (If Applicable).  If you need a refill on your cardiac medications before your next appointment, please call your pharmacy.

## 2016-12-26 DIAGNOSIS — M4722 Other spondylosis with radiculopathy, cervical region: Secondary | ICD-10-CM | POA: Insufficient documentation

## 2016-12-31 ENCOUNTER — Other Ambulatory Visit: Payer: Self-pay

## 2016-12-31 ENCOUNTER — Ambulatory Visit (INDEPENDENT_AMBULATORY_CARE_PROVIDER_SITE_OTHER): Payer: BLUE CROSS/BLUE SHIELD

## 2016-12-31 DIAGNOSIS — R002 Palpitations: Secondary | ICD-10-CM | POA: Diagnosis not present

## 2016-12-31 DIAGNOSIS — R000 Tachycardia, unspecified: Secondary | ICD-10-CM | POA: Diagnosis not present

## 2017-01-02 ENCOUNTER — Telehealth: Payer: Self-pay | Admitting: *Deleted

## 2017-01-02 NOTE — Telephone Encounter (Signed)
Notes recorded by Lesle ChrisHill, Angela G, LPN on 4/4/03479/10/2016 at 9:36 AM EDT Patient notified. Copy to pmd. Follow up scheduled for 02/09/2017 with Dr. Purvis SheffieldKoneswaran. ------  Notes recorded by Laqueta LindenKoneswaran, Suresh A, MD on 12/31/2016 at 1:34 PM EDT Normal.

## 2017-01-26 ENCOUNTER — Ambulatory Visit (INDEPENDENT_AMBULATORY_CARE_PROVIDER_SITE_OTHER): Payer: BLUE CROSS/BLUE SHIELD

## 2017-01-26 DIAGNOSIS — R000 Tachycardia, unspecified: Secondary | ICD-10-CM

## 2017-01-26 DIAGNOSIS — R002 Palpitations: Secondary | ICD-10-CM | POA: Diagnosis not present

## 2017-02-06 ENCOUNTER — Encounter: Payer: Self-pay | Admitting: *Deleted

## 2017-02-09 ENCOUNTER — Ambulatory Visit (INDEPENDENT_AMBULATORY_CARE_PROVIDER_SITE_OTHER): Payer: BLUE CROSS/BLUE SHIELD | Admitting: Cardiovascular Disease

## 2017-02-09 ENCOUNTER — Encounter: Payer: Self-pay | Admitting: Cardiovascular Disease

## 2017-02-09 VITALS — BP 118/88 | HR 86 | Ht 61.0 in | Wt 120.0 lb

## 2017-02-09 DIAGNOSIS — I4711 Inappropriate sinus tachycardia, so stated: Secondary | ICD-10-CM

## 2017-02-09 DIAGNOSIS — R002 Palpitations: Secondary | ICD-10-CM

## 2017-02-09 DIAGNOSIS — R000 Tachycardia, unspecified: Secondary | ICD-10-CM

## 2017-02-09 MED ORDER — METOPROLOL TARTRATE 25 MG PO TABS
12.5000 mg | ORAL_TABLET | ORAL | 3 refills | Status: DC | PRN
Start: 2017-02-09 — End: 2019-03-21
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
## 2017-02-09 NOTE — Patient Instructions (Signed)
Medication Instructions:  Your physician has recommended you make the following change in your medication:  Begin Metoprolol Tartrate 12.5 mg as needed for palpitations lasting longer than 5 minutes or tachycardia Please continue all other medications as prescribed  Labwork: NONE  Testing/Procedures: NONE  Follow-Up: Your physician wants you to follow-up in: 3 MONTHS WITH DR. Purvis Sheffield. You will receive a reminder letter in the mail two months in advance. If you don't receive a letter, please call our office to schedule the follow-up appointment.  Any Other Special Instructions Will Be Listed Below (If Applicable).  If you need a refill on your cardiac medications before your next appointment, please call your pharmacy.

## 2017-04-13 ENCOUNTER — Other Ambulatory Visit: Payer: Self-pay | Admitting: Family Medicine

## 2017-04-13 DIAGNOSIS — Z1231 Encounter for screening mammogram for malignant neoplasm of breast: Secondary | ICD-10-CM

## 2017-05-22 ENCOUNTER — Ambulatory Visit: Payer: BLUE CROSS/BLUE SHIELD

## 2017-06-10 ENCOUNTER — Ambulatory Visit
Admission: RE | Admit: 2017-06-10 | Discharge: 2017-06-10 | Disposition: A | Discharge status: Home / Self Care | Payer: BLUE CROSS/BLUE SHIELD | Source: Ambulatory Visit | Attending: Family Medicine | Admitting: Family Medicine

## 2017-06-10 DIAGNOSIS — Z1231 Encounter for screening mammogram for malignant neoplasm of breast: Secondary | ICD-10-CM

## 2018-04-15 ENCOUNTER — Other Ambulatory Visit: Payer: Self-pay | Admitting: Family Medicine

## 2018-04-15 DIAGNOSIS — Z1231 Encounter for screening mammogram for malignant neoplasm of breast: Secondary | ICD-10-CM

## 2018-06-11 ENCOUNTER — Ambulatory Visit: Payer: BLUE CROSS/BLUE SHIELD

## 2018-06-16 ENCOUNTER — Ambulatory Visit
Admission: RE | Admit: 2018-06-16 | Discharge: 2018-06-16 | Disposition: A | Discharge status: Home / Self Care | Payer: BLUE CROSS/BLUE SHIELD | Source: Ambulatory Visit | Attending: Family Medicine | Admitting: Family Medicine

## 2018-06-16 DIAGNOSIS — Z1231 Encounter for screening mammogram for malignant neoplasm of breast: Secondary | ICD-10-CM

## 2018-11-19 ENCOUNTER — Other Ambulatory Visit: Payer: Self-pay

## 2018-11-22 ENCOUNTER — Encounter: Payer: Self-pay | Admitting: Internal Medicine

## 2018-11-22 ENCOUNTER — Ambulatory Visit: Payer: BC Managed Care – PPO | Admitting: Internal Medicine

## 2018-11-22 ENCOUNTER — Other Ambulatory Visit: Payer: Self-pay

## 2018-11-22 VITALS — BP 118/84 | HR 94 | Temp 98.3°F | Ht 62.0 in | Wt 120.4 lb

## 2018-11-22 DIAGNOSIS — E039 Hypothyroidism, unspecified: Secondary | ICD-10-CM

## 2018-11-22 NOTE — Progress Notes (Signed)
Name: Tracie Ross [facility]  MRN/ DOB: [id number], 1960/11/02    Age/ Sex: 58 y.o., female    PCP: Sharilyn Sites, MD   Reason for Endocrinology Evaluation: Hypothyroidism     Date of Initial Endocrinology Evaluation: 11/23/2018     HPI: Tracie Ross is a 58 y.o. female with a past medical history of Depression, chronic headaches. The patient presented for initial endocrinology clinic visit on 11/23/2018 for consultative assistance with her hypothyroidism.   Pt was diagnosed with hypothyroidism many years ago. Has been on Levothyroxine since her diagnosis. Four years ago she required  Levothyroxine 125 mcg daily, but 2 years ago she started having extreme fatigue with hair loss and nail deformity. Since then her dose has been gradually being reduced.   Has been on 75 mcg since 11/05/2018 and feels at least 25% improved with her energy levels.   She takes it appropriately , she denies skipping or missing any doses. She is not on any vitamins .  Used to be on Vitamin D and B12.      HISTORY:  Past Medical History:  Past Medical History:  Diagnosis Date  . GERD (gastroesophageal reflux disease)   . Hypothyroidism    Past Surgical History:  Past Surgical History:  Procedure Laterality Date  . CERVICAL FUSION  08/07/2016   Dr. Carloyn Manner  . OVARY SURGERY  1998   mass  . TONSILLECTOMY     age 27      Social History:  reports that she has never smoked. She has never used smokeless tobacco.  Family History: family history includes Asthma in her mother; Breast cancer (age of onset: 30) in her mother; COPD in her mother; Cirrhosis in her mother; Dementia in her mother; Heart attack in her father; Hypertension in her father; Hypothyroidism in her mother; Stroke in her father and mother.   HOME MEDICATIONS: Allergies as of 11/22/2018      Reactions   Tape    Iohexol Itching, Rash   Desc: Patient began to itch after receiving omnipaque 300. Was given 25 mg benadryl and felt better.,  Onset Date: [id number]      Medication List       Accurate as of November 22, 2018 11:59 PM. If you have any questions, ask your nurse or doctor.        amitriptyline 50 MG tablet Commonly known as: ELAVIL Take 50 mg by mouth at bedtime.   eletriptan 20 MG tablet Commonly known as: RELPAX Take 20 mg by mouth as needed for migraine or headache. May repeat in 2 hours if headache persists or recurs.   levothyroxine 75 MCG tablet Commonly known as: SYNTHROID Take 75 mcg by mouth daily before breakfast. What changed: Another medication with the same name was removed. Continue taking this medication, and follow the directions you see here. Changed by: Dorita Sciara, MD   Linzess 290 MCG Caps capsule Generic drug: linaclotide Take 290 mcg by mouth daily.   metoprolol tartrate 25 MG tablet Commonly known as: LOPRESSOR Take 0.5 tablets (12.5 mg total) by mouth as needed (Palpitations that last longer than 5 minutes/Tachycardia).   topiramate 100 MG tablet Commonly known as: TOPAMAX Take 100 mg by mouth daily.   Vitamin D3 1.25 MG (50000 UT) Caps Take 1 capsule by mouth once a week.         REVIEW OF SYSTEMS: A comprehensive ROS was conducted with the patient and is negative except as per HPI and  below:  Review of Systems  Constitutional: Positive for malaise/fatigue. Negative for weight loss.  HENT: Negative for congestion and sore throat.   Eyes: Negative for blurred vision and discharge.  Respiratory: Negative for cough and shortness of breath.   Cardiovascular: Negative for chest pain and palpitations.  Gastrointestinal: Positive for constipation.       Chronic  Skin:       Nail deformity   Neurological: Negative for tingling and tremors.  Psychiatric/Behavioral: Negative for depression. The patient is not nervous/anxious.        OBJECTIVE:  VS: BP 118/84 (BP Location: Left Arm, Patient Position: Sitting, Cuff Size: Normal)   Pulse 94   Temp 98.3 F (36.8  C)   Ht 5\' 2"  (1.575 m)   Wt 120 lb 6.4 oz (54.6 kg)   SpO2 98%   BMI 22.02 kg/m    Wt Readings from Last 3 Encounters:  11/22/18 120 lb 6.4 oz (54.6 kg)  02/09/17 120 lb (54.4 kg)  12/22/16 117 lb (53.1 kg)     EXAM: General: Pt appears well and is in NAD  Hydration: Well-hydrated with moist mucous membranes and good skin turgor  Eyes: External eye exam normal without stare, lid lag or exophthalmos.  EOM intact.   Ears, Nose, Throat: Hearing: Grossly intact bilaterally Dental: Good dentition  Throat: Clear without mass, erythema or exudate  Neck: General: Supple without adenopathy. Thyroid: Thyroid size normal.  No goiter or nodules appreciated. No thyroid bruit.  Lungs: Clear with good BS bilat with no rales, rhonchi, or wheezes  Heart: Auscultation: RRR.  Abdomen: Normoactive bowel sounds, soft, nontender, without masses or organomegaly palpable  Extremities: Gait and station: Normal gait  Digits and nails: No clubbing, cyanosis, petechiae, or nodes Head and neck: Normal alignment and mobility BL UE: Normal ROM and strength. BL LE: No pretibial edema normal ROM and strength.  Skin: Hair: Texture and amount normal with gender appropriate distribution Skin Inspection: No rashes. Skin Palpation: Skin temperature, texture, and thickness normal to palpation  Neuro: Cranial nerves: II - XII grossly intact  Motor: Normal strength throughout DTRs: 2+ and symmetric in UE without delay in relaxation phase  Mental Status: Judgment, insight: Intact Orientation: Oriented to time, place, and person Mood and affect: No depression, anxiety, or agitation     DATA REVIEWED: 09/30/2018 TSH 4.690 uIU/mL       ASSESSMENT/PLAN/RECOMMENDATIONS:   1. Hypothyroidism:   - Pt with non-specific symptoms that could be attributed to her thyroid disease.  - No local neck symptoms - Pt educated extensively on the correct way to take levothyroxine (first thing in the morning with water, 30  minutes before eating or taking other medications). - Pt encouraged to double dose the following day if she were to miss a dose given long half-life of levothyroxine. - We will not perform any labs today, we will repeat labs at 8 weeks from the change in LT-4 replacement.    Medications : Levothyroxine 75 mcg daily     Labs in September   F/U in November, 2020  Signed electronically by: Lyndle HerrlichAbby Jaralla Faith Branan, MD  [facility]  [facility] [address] [place], [place] [address] Phone: [phone] FAX: [phone]   CC: Assunta FoundGolding, John, MD [address] [place] [address] Phone: [phone] Fax: [phone]   Return to Endocrinology clinic as below: Future Appointments  Date Time Provider Department Center  01/17/2019 11:15 AM [facility] None  01/31/2019 11:20 AM  Laqueta LindenKoneswaran, Suresh A, MD [facility]  02/28/2019  2:00 PM Eathon Valade, Konrad DoloresIbtehal Jaralla, MD [facility] None

## 2018-11-22 NOTE — Patient Instructions (Signed)
   You are on levothyroxine 75 mcg - which is your thyroid hormone supplement. You MUST take this consistently.  You should take this first thing in the morning on an empty stomach with water. You should not take it with other medications. Wait 30min to 1hr prior to eating. If you are taking any vitamins - please take these in the evening.   If you miss a dose, please take your missed dose the following day (double the dose for that day). You should have a pill box for ONLY levothyroxine on your bedside table to help you remember to take your medications.  

## 2019-01-17 ENCOUNTER — Other Ambulatory Visit (INDEPENDENT_AMBULATORY_CARE_PROVIDER_SITE_OTHER): Payer: BC Managed Care – PPO

## 2019-01-17 ENCOUNTER — Other Ambulatory Visit: Payer: Self-pay

## 2019-01-17 DIAGNOSIS — E039 Hypothyroidism, unspecified: Secondary | ICD-10-CM

## 2019-01-17 LAB — TSH: TSH: 0.3 u[IU]/mL — ABNORMAL LOW (ref 0.35–4.50)

## 2019-01-17 LAB — T4, FREE: Free T4: 1.2 ng/dL (ref 0.60–1.60)

## 2019-01-18 ENCOUNTER — Other Ambulatory Visit: Payer: Self-pay

## 2019-01-18 ENCOUNTER — Telehealth: Payer: Self-pay | Admitting: Internal Medicine

## 2019-01-18 MED ORDER — LEVOTHYROXINE SODIUM 75 MCG PO TABS: 75.0000 ug | ORAL_TABLET | Freq: Every day | ORAL | 3 refills | Status: DC

## 2019-01-18 NOTE — Telephone Encounter (Signed)
Pt aware of changes, refills sent, and f/u scheduled

## 2019-01-18 NOTE — Telephone Encounter (Signed)
Tashia,    Please let her know that her levothyroxine 75 mcg dose is too much for her, needs to reduce the dose some more.    My suggestion is to skip taking levothyroxine on Sundays but continue to take 1 tablet Monday- Saturday every week until she sees me.    Thanks    Abby Nena Jordan, MD  [facility]  [facility] [address] [place], [place] [address] Phone: [phone] FAX: [phone]

## 2019-01-31 ENCOUNTER — Ambulatory Visit: Payer: BLUE CROSS/BLUE SHIELD | Admitting: Cardiovascular Disease

## 2019-02-24 ENCOUNTER — Other Ambulatory Visit: Payer: Self-pay

## 2019-02-28 ENCOUNTER — Ambulatory Visit: Payer: BC Managed Care – PPO | Admitting: Internal Medicine

## 2019-02-28 ENCOUNTER — Encounter: Payer: Self-pay | Admitting: Internal Medicine

## 2019-02-28 VITALS — BP 116/84 | HR 104 | Resp 14 | Wt 119.0 lb

## 2019-02-28 DIAGNOSIS — E039 Hypothyroidism, unspecified: Secondary | ICD-10-CM | POA: Diagnosis not present

## 2019-02-28 NOTE — Patient Instructions (Signed)
You are on levothyroxine 75 mcg- which is your thyroid hormone supplement. You MUST take this consistently except SUNDAYS.  You should take this first thing in the morning on an empty stomach with water. You should not take it with other medications. Wait 53min to 1hr prior to eating. If you are taking any vitamins - please take these in the evening.   If you miss a dose, please take your missed dose the following day (double the dose for that day). You should have a pill box for ONLY levothyroxine on your bedside table to help you remember to take your medications.

## 2019-02-28 NOTE — Progress Notes (Signed)
Name: Tracie Ross [facility]  MRN/ DOB: [id number], May 08, 1960    Age/ Sex: 58 y.o., female     PCP: Assunta Found, MD   Reason for Endocrinology Evaluation: Hypothyroidism      Initial Endocrinology Clinic Visit: 11/23/2018    PATIENT IDENTIFIER: Tracie Ross is a 58 y.o., female with a past medical history of Depression, chronic headaches. She has followed with [facility] since 11/23/2018 for consultative assistance with management of her hypothyroidism.   HISTORICAL SUMMARY: The patient was first diagnosed with hypothyroidism many years ago. Has been on Levothyroxine since her diagnosis. In 2016 she required Levothyroxine 125 mcg daily, but sometime in 2018 she started having extreme fatigue with hair loss and nail deformity and her dose has been gradually  reduced.    SUBJECTIVE:   During last visit (11/23/2018): TSH was low at 0.30 uIU/mL and she was advised to reduce LT-4 replacement to 6 days a week.   Today (02/28/2019):  [name] is here for a follow up on hypothyroidism. She is compliant with LT-4 replacement. She takes appropriately  Hair loss has improved.   Takes linzess with chronic constipation  No depression   Fatigue improved initially but recently worsened again.   No biotin bottle.    ROS:  As per HPI.   HISTORY:  Past Medical History:  Past Medical History:  Diagnosis Date  . GERD (gastroesophageal reflux disease)   . Hypothyroidism    Past Surgical History:  Past Surgical History:  Procedure Laterality Date  . CERVICAL FUSION  08/07/2016   Dr. Channing Mutters  . OVARY SURGERY  1998   mass  . TONSILLECTOMY     age 70    Social History:  reports that she has never smoked. She has never used smokeless tobacco. No history on file for alcohol and drug. Family History:  Family History  Problem Relation Age of Onset  . COPD Mother   . Dementia Mother   . Cirrhosis Mother   . Asthma Mother   . Hypothyroidism Mother   . Stroke Mother    . Breast cancer Mother 78  . Hypertension Father   . Heart attack Father   . Stroke Father      HOME MEDICATIONS: Allergies as of 02/28/2019      Reactions   Tape    Iohexol Itching, Rash   Desc: Patient began to itch after receiving omnipaque 300. Was given 25 mg benadryl and felt better., Onset Date: [id number]      Medication List       Accurate as of February 28, 2019  2:04 PM. If you have any questions, ask your nurse or doctor.        amitriptyline 50 MG tablet Commonly known as: ELAVIL Take 50 mg by mouth at bedtime.   eletriptan 20 MG tablet Commonly known as: RELPAX Take 20 mg by mouth as needed for migraine or headache. May repeat in 2 hours if headache persists or recurs.   levothyroxine 75 MCG tablet Commonly known as: SYNTHROID Take 1 tablet (75 mcg total) by mouth daily before breakfast. Except Sundays   Linzess 290 MCG Caps capsule Generic drug: linaclotide Take 290 mcg by mouth daily.   metoprolol tartrate 25 MG tablet Commonly known as: LOPRESSOR Take 0.5 tablets (12.5 mg total) by mouth as needed (Palpitations that last longer than 5 minutes/Tachycardia).   topiramate 100 MG tablet Commonly known as: TOPAMAX Take 100 mg by mouth daily.   Vitamin D3  1.25 MG (50000 UT) Caps Take 1 capsule by mouth once a week.         OBJECTIVE:   PHYSICAL EXAM: VS: BP 116/84   Pulse (!) 104   Resp 14   Wt 119 lb (54 kg)   SpO2 97%   BMI 21.77 kg/m    EXAM: General: Pt appears well and is in NAD  Neck: General: Supple without adenopathy. Thyroid: Thyroid size normal.  No goiter or nodules appreciated. No thyroid bruit.  Lungs: Clear with good BS bilat with no rales, rhonchi, or wheezes  Heart: Auscultation: RRR.  Abdomen: Normoactive bowel sounds, soft, nontender, without masses or organomegaly palpable  Extremities:  BL LE: No pretibial edema normal ROM and strength.  Mental Status: Judgment, insight: Intact Orientation: Oriented to time, place,  and person Mood and affect: No depression, anxiety, or agitation     DATA REVIEWED:  Results for MYLEEN, BRAILSFORD (MRN [id number]) as of 02/28/2019 14:57  Ref. Range 01/17/2019 11:15  TSH Latest Ref Range: 0.35 - 4.50 uIU/mL 0.30 (L)  T4,Free(Direct) Latest Ref Range: 0.60 - 1.60 ng/dL 1.20     ASSESSMENT / PLAN / RECOMMENDATIONS:   1. Hypothyroidism   - Clinically she is euthyroid  - She has been compliant with LT-4 replacement.  -  Pt educated extensively on the correct way to take levothyroxine (first thing in the morning with water, 30 minutes before eating or taking other medications). - Pt encouraged to double dose the following day if she were to miss a dose given long half-life of levothyroxine. - She will have labs on 11/20th   Medications   Levothyroxine 75 mcg , 6 x a week   F/U in 6 months    Signed electronically by: Mack Guise, MD  [facility]  [facility] [address], [place] [address] Phone: [phone] FAX: [phone]      CC: Sharilyn Sites, [place] [address] Phone: [phone]  Fax: [phone]   Return to Endocrinology clinic as below: Future Appointments  Date Time Provider [place]  03/18/2019 10:10 AM , Melanie Crazier, MD [facility] None  03/21/2019 11:20 AM Herminio Commons, MD [facility]

## 2019-03-11 ENCOUNTER — Other Ambulatory Visit: Payer: Self-pay | Admitting: Internal Medicine

## 2019-03-11 DIAGNOSIS — E039 Hypothyroidism, unspecified: Secondary | ICD-10-CM

## 2019-03-18 ENCOUNTER — Ambulatory Visit: Payer: BC Managed Care – PPO | Admitting: Internal Medicine

## 2019-03-18 ENCOUNTER — Other Ambulatory Visit: Payer: Self-pay

## 2019-03-18 ENCOUNTER — Other Ambulatory Visit (INDEPENDENT_AMBULATORY_CARE_PROVIDER_SITE_OTHER): Payer: BC Managed Care – PPO

## 2019-03-18 DIAGNOSIS — E039 Hypothyroidism, unspecified: Secondary | ICD-10-CM

## 2019-03-18 LAB — T4, FREE: Free T4: 1.36 ng/dL (ref 0.60–1.60)

## 2019-03-18 LAB — TSH: TSH: 0.86 u[IU]/mL (ref 0.35–4.50)

## 2019-03-21 ENCOUNTER — Encounter: Payer: Self-pay | Admitting: *Deleted

## 2019-03-21 ENCOUNTER — Encounter: Payer: Self-pay | Admitting: Cardiovascular Disease

## 2019-03-21 ENCOUNTER — Other Ambulatory Visit: Payer: Self-pay

## 2019-03-21 ENCOUNTER — Encounter: Payer: Self-pay | Admitting: Internal Medicine

## 2019-03-21 ENCOUNTER — Ambulatory Visit (INDEPENDENT_AMBULATORY_CARE_PROVIDER_SITE_OTHER): Payer: BC Managed Care – PPO | Admitting: Cardiovascular Disease

## 2019-03-21 VITALS — BP 128/84 | HR 114 | Ht 61.0 in | Wt 120.6 lb

## 2019-03-21 DIAGNOSIS — R002 Palpitations: Secondary | ICD-10-CM | POA: Diagnosis not present

## 2019-03-21 DIAGNOSIS — R000 Tachycardia, unspecified: Secondary | ICD-10-CM | POA: Diagnosis not present

## 2019-03-21 DIAGNOSIS — R0789 Other chest pain: Secondary | ICD-10-CM | POA: Diagnosis not present

## 2019-03-21 MED ORDER — METOPROLOL TARTRATE 25 MG PO TABS: 12.5000 mg | ORAL_TABLET | Freq: Two times a day (BID) | ORAL | 3 refills | Status: DC

## 2019-03-21 NOTE — Patient Instructions (Addendum)
Medication Instructions:   Resume Lopressor 12.5mg  at twice a day   Continue all other medications.    Labwork: none  Testing/Procedures: none  Follow-Up: Your physician wants you to follow up in: 6 months.  You will receive a reminder letter in the mail one-two months in advance.  If you don't receive a letter, please call our office to schedule the follow up appointment   Any Other Special Instructions Will Be Listed Below (If Applicable).  If you need a refill on your cardiac medications before your next appointment, please call your pharmacy.

## 2019-03-21 NOTE — Progress Notes (Signed)
SUBJECTIVE: The patient presents for the evaluation of chest pain.  I last saw her in October 58.  She underwent a normal echocardiogram on 12/31/2016, LVEF 55 to 60%.  She told me that she had been experiencing a pressure-like sensation in the center of her chest.  She then saw endocrinologist who made adjustments in her thyroid medication and the symptoms have resolved.  She did complain to me about tachycardia and palpitations.  Her heart rate is gone as high as 121 bpm.  An ECG was done at her PCPs office which I will have to request.  She denies exertional chest pain dyspnea.  She denies leg swelling, orthopnea and paroxysmal nocturnal dyspnea.  She has had cramping of the feet but denies claudication pain.   Review of Systems: As per "subjective", otherwise negative.  Allergies  Allergen Reactions  . Tape   . Iohexol Itching and Rash    Desc: Patient began to itch after receiving omnipaque 300. Was given 25 mg benadryl and felt better., Onset Date: [id number]     Current Outpatient Medications  Medication Sig Dispense Refill  . amitriptyline (ELAVIL) 50 MG tablet Take 50 mg by mouth at bedtime.    . Cholecalciferol (VITAMIN D) 50 MCG (2000 UT) tablet Take 2,000 Units by mouth daily.    Marland Kitchen eletriptan (RELPAX) 20 MG tablet Take 20 mg by mouth as needed for migraine or headache. May repeat in 2 hours if headache persists or recurs.    Marland Kitchen levothyroxine (SYNTHROID) 75 MCG tablet Take 1 tablet (75 mcg total) by mouth daily before breakfast. Except Sundays 30 tablet 3  . linaclotide (LINZESS) 290 MCG CAPS capsule Take 290 mcg by mouth daily.    . metoprolol tartrate (LOPRESSOR) 25 MG tablet Take 0.5 tablets (12.5 mg total) by mouth as needed (Palpitations that last longer than 5 minutes/Tachycardia). 45 tablet 3  . topiramate (TOPAMAX) 100 MG tablet Take 100 mg by mouth daily.    Marland Kitchen zinc gluconate 50 MG tablet Take 50 mg by mouth daily.     No current facility-administered  medications for this visit.     Past Medical History:  Diagnosis Date  . GERD (gastroesophageal reflux disease)   . Hypothyroidism     Past Surgical History:  Procedure Laterality Date  . CERVICAL FUSION  08/07/2016   Dr. Channing Mutters  . OVARY SURGERY  1998   mass  . TONSILLECTOMY     age 58    Social History   Socioeconomic History  . Marital status: Married    Spouse name: Not on file  . Number of children: Not on file  . Years of education: Not on file  . Highest education level: Not on file  Occupational History  . Not on file  Social Needs  . Financial resource strain: Not on file  . Food insecurity    Worry: Not on file    Inability: Not on file  . Transportation needs    Medical: Not on file    Non-medical: Not on file  Tobacco Use  . Smoking status: Never Smoker  . Smokeless tobacco: Never Used  Substance and Sexual Activity  . Alcohol use: Not on file  . Drug use: Not on file  . Sexual activity: Not on file  Lifestyle  . Physical activity    Days per week: Not on file    Minutes per session: Not on file  . Stress: Not on file  Relationships  .  Social Herbalist on phone: Not on file    Gets together: Not on file    Attends religious service: Not on file    Active member of club or organization: Not on file    Attends meetings of clubs or organizations: Not on file    Relationship status: Not on file  . Intimate partner violence    Fear of current or ex partner: Not on file    Emotionally abused: Not on file    Physically abused: Not on file    Forced sexual activity: Not on file  Other Topics Concern  . Not on file  Social History Narrative  . Not on file     Vitals:   03/21/19 1109  BP: 128/84  Pulse: (!) 114  SpO2: 96%  Weight: 120 lb 9.6 oz (54.7 kg)  Height: 5\' 1"  (1.549 m)    Wt Readings from Last 3 Encounters:  03/21/19 120 lb 9.6 oz (54.7 kg)  02/28/19 119 lb (54 kg)  11/22/18 120 lb 6.4 oz (54.6 kg)     PHYSICAL  EXAM General: NAD HEENT: Normal. Neck: No JVD, no thyromegaly. Lungs: Clear to auscultation bilaterally with normal respiratory effort. CV: Regular rate and rhythm, normal S1/S2, no S3/S4, no murmur. No pretibial or periankle edema.  Abdomen: Soft, nontender, no distention.  Neurologic: Alert and oriented.  Psych: Normal affect. Skin: Normal. Musculoskeletal: No gross deformities.      Labs: Lab Results  Component Value Date/Time   TSH 0.86 03/18/2019 02:21 PM     Lipids: No results found for: LDLCALC, LDLDIRECT, CHOL, TRIG, HDL     ASSESSMENT AND PLAN: 1.  Chest pain: Symptoms have resolved with adjustments in thyroid medication.  No indication for stress testing at this time.  2.  Tachycardia/palpitations: Event monitoring in October 2018 demonstrated paroxysms of sinus tachycardia with rates as high as 148 bpm.  She is mildly tachycardic today with heart rates getting as high as 121 bpm at home.  I will obtain a copy of the ECG from her PCP.  I will start Lopressor 12.5 mg twice daily.    Disposition: Follow up 6 months   Kate Sable, M.D., F.A.C.C.

## 2019-03-22 ENCOUNTER — Telehealth: Payer: Self-pay

## 2019-03-22 NOTE — Telephone Encounter (Signed)
Patient called in wanting to know he lab results. Told her a letter was mailed and she still wanted to speak with nurse    Please call and advise

## 2019-03-22 NOTE — Telephone Encounter (Signed)
Spoke to pt and read what was in the letter, so pt aware of results.

## 2019-05-27 ENCOUNTER — Other Ambulatory Visit: Payer: Self-pay | Admitting: Family Medicine

## 2019-05-27 DIAGNOSIS — Z1231 Encounter for screening mammogram for malignant neoplasm of breast: Secondary | ICD-10-CM

## 2019-07-06 ENCOUNTER — Ambulatory Visit: Payer: BC Managed Care – PPO

## 2019-07-28 ENCOUNTER — Ambulatory Visit: Payer: BC Managed Care – PPO

## 2019-08-09 ENCOUNTER — Ambulatory Visit
Admission: RE | Admit: 2019-08-09 | Discharge: 2019-08-09 | Disposition: A | Discharge status: Home / Self Care | Payer: BC Managed Care – PPO | Source: Ambulatory Visit | Attending: Family Medicine | Admitting: Family Medicine

## 2019-08-09 ENCOUNTER — Other Ambulatory Visit: Payer: Self-pay

## 2019-08-09 DIAGNOSIS — Z1231 Encounter for screening mammogram for malignant neoplasm of breast: Secondary | ICD-10-CM

## 2019-08-17 ENCOUNTER — Other Ambulatory Visit: Payer: Self-pay | Admitting: Internal Medicine

## 2019-08-25 ENCOUNTER — Other Ambulatory Visit: Payer: Self-pay

## 2019-08-29 ENCOUNTER — Ambulatory Visit: Payer: BC Managed Care – PPO | Admitting: Internal Medicine

## 2019-08-30 ENCOUNTER — Other Ambulatory Visit: Payer: Self-pay

## 2019-08-30 ENCOUNTER — Ambulatory Visit: Payer: BC Managed Care – PPO | Admitting: Internal Medicine

## 2019-08-30 ENCOUNTER — Encounter: Payer: Self-pay | Admitting: Internal Medicine

## 2019-08-30 VITALS — BP 118/82 | HR 88 | Temp 97.5°F | Ht 62.0 in | Wt 122.0 lb

## 2019-08-30 DIAGNOSIS — E039 Hypothyroidism, unspecified: Secondary | ICD-10-CM | POA: Diagnosis not present

## 2019-08-30 NOTE — Patient Instructions (Signed)

## 2019-08-30 NOTE — Progress Notes (Signed)
Name: Leidy Massar [facility]  MRN/ DOB: [id number], [birth year]    Age/ Sex: 59 y.o., female     PCP: Assunta Found, MD   Reason for Endocrinology Evaluation: Hypothyroidism      Initial Endocrinology Clinic Visit: 11/23/2018    PATIENT IDENTIFIER: Tracie Ross is a 59 y.o., female with a past medical history of Depression, chronic headaches. She has followed with [facility] since 11/23/2018 for consultative assistance with management of her hypothyroidism.   HISTORICAL SUMMARY: The patient was first diagnosed with hypothyroidism many years ago. Has been on Levothyroxine since her diagnosis. In 2016 she required Levothyroxine 125 mcg daily, but sometime in 2018 she started having extreme fatigue with hair loss and nail deformity and her dose has been gradually  reduced.    SUBJECTIVE:   During last visit (11/23/2018): TSH was low at 0.30 uIU/mL and she was advised to reduce LT-4 replacement to 6 days a week.   Today (08/30/2019):  Tracie Ross is here for a follow up on hypothyroidism. She is compliant with LT-4 replacement. She takes appropriately    She continues with hair loss and fatigue, toe nails flaky  Takes linzess for chronic constipation  No depression   Denies any local neck symptoms  No biotin    ROS:  As per HPI.   HISTORY:  Past Medical History:  Past Medical History:  Diagnosis Date  . GERD (gastroesophageal reflux disease)   . Hypothyroidism    Past Surgical History:  Past Surgical History:  Procedure Laterality Date  . CERVICAL FUSION  08/07/2016   Dr. Channing Mutters  . OVARY SURGERY  1998   [facility]  . TONSILLECTOMY     age 46    Social History:  reports that she has never smoked. She has never used smokeless tobacco. No history on file for alcohol and drug. Family History:  Family History  Problem Relation Age of Onset  . COPD Mother   . Dementia Mother   . Cirrhosis Mother   . Asthma Mother   . Hypothyroidism Mother   . Stroke Mother     . Breast cancer Mother 13  . Hypertension Father   . Heart attack Father   . Stroke Father      HOME MEDICATIONS: Allergies as of 08/30/2019      Reactions   Tape    Iohexol Itching, Rash   Desc: Patient began to itch after receiving omnipaque 300. Was given 25 mg benadryl and felt better., Onset Date: [id number]      Medication List       Accurate as of Aug 30, 2019  3:59 PM. If you have any questions, ask your nurse or doctor.        amitriptyline 50 MG tablet Commonly known as: ELAVIL Take 50 mg by mouth at bedtime.   eletriptan 20 MG tablet Commonly known as: RELPAX Take 20 mg by mouth as needed for migraine or headache. May repeat in 2 hours if headache persists or recurs.   levothyroxine 75 MCG tablet Commonly known as: SYNTHROID TAKE 1 TABLET (75 MCG TOTAL) BY MOUTH DAILY BEFORE BREAKFAST. EXCEPT SUNDAYS   Linzess 290 MCG Caps capsule Generic drug: linaclotide Take 290 mcg by mouth daily.   metoprolol tartrate 25 MG tablet Commonly known as: LOPRESSOR Take 0.5 tablets (12.5 mg total) by mouth 2 (two) times daily.   topiramate 100 MG tablet Commonly known as: TOPAMAX Take 100 mg by mouth daily.   Vitamin D 50  MCG (2000 UT) tablet Take 2,000 Units by mouth daily.   zinc gluconate 50 MG tablet Take 50 mg by mouth daily.         OBJECTIVE:   PHYSICAL EXAM: VS: BP 118/82 (BP Location: Left Arm, Patient Position: Sitting, Cuff Size: Normal)   Pulse 88   Temp (!) 97.5 F (36.4 C)   Ht 5\' 2"  (1.575 m)   Wt 122 lb (55.3 kg)   SpO2 96%   BMI 22.31 kg/m    EXAM: General: Pt appears well and is in NAD  Neck: General: Supple without adenopathy. Thyroid: Thyroid size normal.  No goiter or nodules appreciated. No thyroid bruit.  Lungs: Clear with good BS bilat with no rales, rhonchi, or wheezes  Heart: Auscultation: RRR.  Abdomen: Normoactive bowel sounds, soft, nontender, without masses or organomegaly palpable  Extremities:  BL LE: No pretibial  edema normal ROM and strength.  Mental Status: Judgment, insight: Intact Orientation: Oriented to time, place, and person Mood and affect: No depression, anxiety, or agitation     DATA REVIEWED: Results for Tracie, Ross (MRN [id number]) as of 08/30/2019 15:59  Ref. Range 03/18/2019 14:21  TSH Latest Ref Range: 0.35 - 4.50 uIU/mL 0.86  T4,Free(Direct) Latest Ref Range: 0.60 - 1.60 ng/dL 1.36     ASSESSMENT / PLAN / RECOMMENDATIONS:   1. Hypothyroidism   - Pt with non-specific symptoms that could be attributed to her thyroid  - She has been compliant with LT-4 replacement.  - Unfortunately we don't have a phlebotomist today , she will stop by for TFT check next week.     Medications   Levothyroxine 75 mcg , 6 x a week   F/U in 1 yr    Signed electronically by: Mack Guise, MD  [facility]  [facility] [address] [place], [place] [address] Phone: [phone] FAX: [phone]      CC: [address], [place] [address] Phone: [phone]  Fax: [phone]   Return to Endocrinology clinic as below: Future Appointments  Date Time Provider [place]  10/18/2019  2:20 PM Herminio Commons, MD [facility] [place] [facility]

## 2019-09-07 ENCOUNTER — Other Ambulatory Visit: Payer: Self-pay

## 2019-09-07 ENCOUNTER — Other Ambulatory Visit (INDEPENDENT_AMBULATORY_CARE_PROVIDER_SITE_OTHER): Payer: BC Managed Care – PPO

## 2019-09-07 DIAGNOSIS — E039 Hypothyroidism, unspecified: Secondary | ICD-10-CM | POA: Diagnosis not present

## 2019-09-07 LAB — TSH: TSH: 0.13 u[IU]/mL — ABNORMAL LOW (ref 0.35–4.50)

## 2019-09-07 LAB — T4, FREE: Free T4: 1.35 ng/dL (ref 0.60–1.60)

## 2019-09-09 ENCOUNTER — Telehealth: Payer: Self-pay | Admitting: Internal Medicine

## 2019-09-09 DIAGNOSIS — E039 Hypothyroidism, unspecified: Secondary | ICD-10-CM

## 2019-09-09 MED ORDER — LEVOTHYROXINE SODIUM 50 MCG PO TABS: 50.0000 ug | ORAL_TABLET | ORAL | 3 refills | Status: DC

## 2019-09-09 NOTE — Telephone Encounter (Signed)
Please let her know the current dose of Levothyroxine 75 mcg taken 6 days a week is way too much for her, will need to reduce the dose.    I suggest switching to 50 mcg tabs ( prescription sent) but she needs to take TWO on Sundays and 1 tablet Monday through Saturday .   Please schedule her for labs in 8 weeks     Thanks    Abby Raelyn Mora, MD  [facility]  [facility] [address] [place], [place] [address] Phone: [phone] FAX: [phone]

## 2019-09-09 NOTE — Telephone Encounter (Signed)
Pt aware of results and repeat labs scheduled

## 2019-10-17 ENCOUNTER — Ambulatory Visit: Payer: BC Managed Care – PPO | Admitting: Cardiovascular Disease

## 2019-10-18 ENCOUNTER — Ambulatory Visit: Payer: BC Managed Care – PPO | Admitting: Cardiovascular Disease

## 2019-11-01 ENCOUNTER — Other Ambulatory Visit (INDEPENDENT_AMBULATORY_CARE_PROVIDER_SITE_OTHER): Payer: BC Managed Care – PPO

## 2019-11-01 ENCOUNTER — Other Ambulatory Visit: Payer: Self-pay

## 2019-11-01 ENCOUNTER — Encounter: Payer: Self-pay | Admitting: Internal Medicine

## 2019-11-01 DIAGNOSIS — E039 Hypothyroidism, unspecified: Secondary | ICD-10-CM | POA: Diagnosis not present

## 2019-11-01 LAB — TSH: TSH: 0.57 u[IU]/mL (ref 0.35–4.50)

## 2019-11-01 LAB — T4, FREE: Free T4: 1.2 ng/dL (ref 0.60–1.60)

## 2019-11-02 ENCOUNTER — Ambulatory Visit: Payer: BC Managed Care – PPO | Admitting: Cardiovascular Disease

## 2019-11-04 ENCOUNTER — Telehealth: Payer: Self-pay | Admitting: Internal Medicine

## 2019-11-04 NOTE — Telephone Encounter (Signed)
Pt has called twice wanting her results, I did inform her that you have not had a chance to result them because you have been in clinic.Please advise.

## 2019-11-04 NOTE — Telephone Encounter (Signed)
Patient requests to be called at ph# [phone] to be given her recent lab results

## 2019-11-04 NOTE — Telephone Encounter (Signed)
Informed pt of letter and her results but she stated that she feels worse and could not believe the results. She wanted to know when she should schedule a f/u?

## 2019-11-07 NOTE — Telephone Encounter (Signed)
Pt informed

## 2019-11-10 ENCOUNTER — Ambulatory Visit: Payer: BC Managed Care – PPO | Admitting: Family Medicine

## 2019-11-23 NOTE — Progress Notes (Signed)
Cardiology Office Note  Date: 11/24/2019   ID: Tracie Ross, DOB May 17, 1960, MRN [id number]  PCP:  Assunta Found, MD  Cardiologist:  No primary care provider on file. Electrophysiologist:  None   Chief Complaint: Follow-up palpitations  History of Present Illness: Tracie Ross is a 59 y.o. female with a history of palpitations, tachycardia, chest pressure.  Normal echo none 09/14/2016, LVEF of 55 to 60%.  Last saw Dr. Purvis Sheffield 03/21/2019.  She had been experiencing pressure-like sensation in the center of her chest.  She saw her endocrinologist who made adjustments in thyroid medications and symptoms resolved.  Complained of tachycardia and palpitations.  Heart rate had gone as high as 121 bpm.  She denied any exertional chest pain or dyspnea.  No leg swelling, orthopnea or PND.  She was started on Lopressor 12.5 mg p.o. twice daily.   She presents for follow-up today.  States she has occasional palpitations mostly when she is exercising/walking.  States this seems to increase the frequency of palpitations.  Otherwise she denies any recent issues other than a recent decrease in her levothyroxine dosage by her endocrinologist.  She denies any anginal or exertional symptoms, orthostatic symptoms, PND, orthopnea, CVA TIA symptoms, bleeding, claudication-like symptoms.  She does complain of some right leg pain which seems to occur with and without exertional activity.  Patient had some issues with her cervical spine.  She denies any radicular pain in her lower proximal spine or sciatic-like symptoms.  She states the pain in her right leg usually originates behind the knee.  She denies any DVT or PE-like symptoms, lower extremity edema, or claudication-like symptoms.  She states she believes she has a vitamin B12 deficiency.  She has been taking some OTC B12 supplements.  States she has had issues with low B12 in the past and had to receive frequent injections.  She states two symptoms she  has been experiencing recently are hair loss and  brittle nails.  Past Medical History:  Diagnosis Date  . GERD (gastroesophageal reflux disease)   . Hypothyroidism     Past Surgical History:  Procedure Laterality Date  . CERVICAL FUSION  08/07/2016   Dr. Channing Mutters  . OVARY SURGERY  1998   [facility]  . TONSILLECTOMY     age 58    Current Outpatient Medications  Medication Sig Dispense Refill  . amitriptyline (ELAVIL) 50 MG tablet Take 50 mg by mouth at bedtime.    . Cholecalciferol (VITAMIN D) 50 MCG (2000 UT) tablet Take 2,000 Units by mouth daily.    Marland Kitchen eletriptan (RELPAX) 20 MG tablet Take 20 mg by mouth as needed for migraine or headache. May repeat in 2 hours if headache persists or recurs.    Marland Kitchen levothyroxine (SYNTHROID) 50 MCG tablet Take 1 tablet (50 mcg total) by mouth as directed. 64 tablet 3  . linaclotide (LINZESS) 290 MCG CAPS capsule Take 290 mcg by mouth daily.    . metoprolol tartrate (LOPRESSOR) 25 MG tablet Take 0.5 tablet by mouth (12.5 mg) daily.    Marland Kitchen topiramate (TOPAMAX) 100 MG tablet Take 100 mg by mouth daily.    . vitamin B-12 (CYANOCOBALAMIN) 1000 MCG tablet Take 1,000 mcg by mouth daily.     No current facility-administered medications for this visit.   Allergies:  Tape and Iohexol   Social History: The patient  reports that she has never smoked. She has never used smokeless tobacco.   Family History: The patient's family history includes Asthma  in her mother; Breast cancer (age of onset: 84) in her mother; COPD in her mother; Cirrhosis in her mother; Dementia in her mother; Heart attack in her father; Hypertension in her father; Hypothyroidism in her mother; Stroke in her father and mother.   ROS:  Please see the history of present illness. Otherwise, complete review of systems is positive for none.  All other systems are reviewed and negative.   Physical Exam: VS:  BP 106/84   Pulse 92   Ht 5' (1.524 m)   Wt 119 lb (54 kg)   SpO2 98%   BMI 23.24 kg/m , BMI  Body mass index is 23.24 kg/m.  Wt Readings from Last 3 Encounters:  11/24/19 119 lb (54 kg)  08/30/19 122 lb (55.3 kg)  03/21/19 120 lb 9.6 oz (54.7 kg)    General: Patient appears comfortable at rest. Neck: Supple, no elevated JVP or carotid bruits, no thyromegaly. Lungs: Clear to auscultation, nonlabored breathing at rest. Cardiac: Regular rate and rhythm, no S3 or significant systolic murmur, no pericardial rub. Extremities: No pitting edema, distal pulses 2+. Skin: Warm and dry. Musculoskeletal: No kyphosis. Neuropsychiatric: Alert and oriented x3, affect grossly appropriate.  ECG:  An ECG dated 11/24/2019 was personally reviewed today and demonstrated:  Sinus rhythm with short PR rate of 95.  Otherwise normal ECG  Recent Labwork: 11/01/2019: TSH 0.57  No results found for: CHOL, TRIG, HDL, CHOLHDL, VLDL, LDLCALC, LDLDIRECT  Other Studies Reviewed Today:  Cardiac monitor 01/26/2017 Study Highlights   Patient unable to wear monitor for entire period of time.  Paroxysms of sinus tachycardia seen with rates as high as 148 bpm.     Echocardiogram 12/31/2016  Study Conclusions   - Left ventricle: The cavity size was normal. Wall thickness was  normal. Systolic function was normal. The estimated ejection  fraction was in the range of 55% to 60%. Wall motion was normal;  there were no regional wall motion abnormalities. Left  ventricular diastolic function parameters were normal.  - Aortic valve: Mildly calcified annulus. Trileaflet.  - Mitral valve: There was trivial regurgitation.  - Right atrium: Central venous pressure (est): 3 mm Hg.  - Atrial septum: No defect or patent foramen ovale was identified.  - Tricuspid valve: There was trivial regurgitation.  - Pericardium, extracardiac: There was no pericardial effusion.   Impressions:   - Normal LV wall thickness with LVEF 55-60% and normal diastolic  function. Trivial mitral regurgitation. Mildly calcified  aortic  annulus. Trivial tricuspid regurgitation.  Assessment and Plan:  1. Chest pain of uncertain etiology   2. Palpitations   3. Tachycardia    1. Chest pain of uncertain etiology Denies any anginal or exertional symptoms.  2. Palpitations States she still has some palpitations mostly when she is exercising/walking outdoors with her husband.  States she can have some increased palpitations during that time.  She continues to take 12.5 mg of metoprolol tartrate daily.  Advised her she could take an extra dose as needed for increased palpitations.  She verbalizes understanding.  3. Tachycardia EKG today on arrival shows sinus rhythm with short PR interval rate of 95.  Continue metoprolol tartrate 12.5 mg daily.  Medication Adjustments/Labs and Tests Ordered: Current medicines are reviewed at length with the patient today.  Concerns regarding medicines are outlined above.   Disposition: Follow-up with Nona Dell or APP 6 months  Signed, Rennis Harding, NP 11/24/2019 10:01 AM    [facility]  [place], [place], [place] [address] Phone: [phone]; Fax: [phone]

## 2019-11-24 ENCOUNTER — Encounter: Payer: Self-pay | Admitting: Family Medicine

## 2019-11-24 ENCOUNTER — Ambulatory Visit: Payer: BC Managed Care – PPO | Admitting: Family Medicine

## 2019-11-24 VITALS — BP 106/84 | HR 92 | Ht 60.0 in | Wt 119.0 lb

## 2019-11-24 DIAGNOSIS — R002 Palpitations: Secondary | ICD-10-CM

## 2019-11-24 DIAGNOSIS — R000 Tachycardia, unspecified: Secondary | ICD-10-CM | POA: Diagnosis not present

## 2019-11-24 DIAGNOSIS — R079 Chest pain, unspecified: Secondary | ICD-10-CM

## 2019-11-24 NOTE — Patient Instructions (Signed)
Medication Instructions:  *If you need a refill on your cardiac medications before your next appointment, please call your pharmacy*  Lab Work: If you have labs (blood work) drawn today and your tests are completely normal, you will receive your results only by: Marland Kitchen MyChart Message (if you have MyChart) OR . A paper copy in the mail If you have any lab test that is abnormal or we need to change your treatment, we will call you to review the results.  Follow-Up: At [facility], you and your health needs are our priority.  As part of our continuing mission to provide you with exceptional heart care, we have created designated Provider Care Teams.  These Care Teams include your primary Cardiologist (physician) and Advanced Practice Providers (APPs -  Physician Assistants and Nurse Practitioners) who all work together to provide you with the care you need, when you need it.  We recommend signing up for the patient portal called "MyChart".  Sign up information is provided on this After Visit Summary.  MyChart is used to connect with patients for Virtual Visits (Telemedicine).  Patients are able to view lab/test results, encounter notes, upcoming appointments, etc.  Non-urgent messages can be sent to your provider as well.   To learn more about what you can do with MyChart, go to [email or web address].    Your next appointment:   Your physician wants you to follow-up in: 6 MONTHS with Nena Polio, NP.   The format for your next appointment:   In Person with Nena Polio, NP

## 2019-12-02 ENCOUNTER — Other Ambulatory Visit: Payer: Self-pay | Admitting: Internal Medicine

## 2019-12-24 ENCOUNTER — Other Ambulatory Visit: Payer: Self-pay | Admitting: Internal Medicine

## 2019-12-26 ENCOUNTER — Other Ambulatory Visit: Payer: Self-pay | Admitting: Internal Medicine

## 2019-12-26 ENCOUNTER — Other Ambulatory Visit: Payer: Self-pay

## 2019-12-26 MED ORDER — LEVOTHYROXINE SODIUM 75 MCG PO TABS: ORAL_TABLET | ORAL | 0 refills | Status: DC

## 2019-12-26 MED ORDER — LEVOTHYROXINE SODIUM 75 MCG PO TABS: ORAL_TABLET | ORAL | 2 refills | Status: DC

## 2020-05-05 ENCOUNTER — Encounter: Payer: Self-pay | Admitting: Emergency Medicine

## 2020-05-05 ENCOUNTER — Other Ambulatory Visit: Payer: Self-pay

## 2020-05-05 ENCOUNTER — Ambulatory Visit
Admission: EM | Admit: 2020-05-05 | Discharge: 2020-05-05 | Disposition: A | Discharge status: Home / Self Care | Payer: BC Managed Care – PPO | Attending: Family Medicine | Admitting: Family Medicine

## 2020-05-05 DIAGNOSIS — R197 Diarrhea, unspecified: Secondary | ICD-10-CM

## 2020-05-05 DIAGNOSIS — B349 Viral infection, unspecified: Secondary | ICD-10-CM | POA: Diagnosis not present

## 2020-05-05 DIAGNOSIS — R5383 Other fatigue: Secondary | ICD-10-CM | POA: Diagnosis not present

## 2020-05-05 DIAGNOSIS — R509 Fever, unspecified: Secondary | ICD-10-CM

## 2020-05-05 DIAGNOSIS — R059 Cough, unspecified: Secondary | ICD-10-CM

## 2020-05-05 DIAGNOSIS — R110 Nausea: Secondary | ICD-10-CM

## 2020-05-05 MED ORDER — ONDANSETRON HCL 4 MG PO TABS: 4.0000 mg | ORAL_TABLET | Freq: Four times a day (QID) | ORAL | 0 refills | Status: DC

## 2020-05-05 MED ORDER — BENZONATATE 100 MG PO CAPS: 100.0000 mg | ORAL_CAPSULE | Freq: Three times a day (TID) | ORAL | 0 refills | Status: DC

## 2020-05-05 NOTE — Discharge Instructions (Signed)
I have sent in Zofran for you to take one tablet every 8 hours as needed for nausea.  I have sent in tessalon perles for you to use one capsule every 8 hours as needed for cough.  Your COVID and Flu tests are pending.  You should self quarantine until the test results are back.    Take Tylenol or ibuprofen as needed for fever or discomfort.  Rest and keep yourself hydrated.    Follow-up with your primary care provider if your symptoms are not improving.

## 2020-05-05 NOTE — ED Provider Notes (Signed)
[facility]   [id number] 05/05/20 Arrival Time: 0809   CC: COVID symptoms  SUBJECTIVE: History from: patient.  Tracie Ross is a 60 y.o. female who presents with vomiting and achy x 3 days. Reports that she has had fever on and off as well as congestion. Denies sick exposure to COVID, flu or strep. Denies recent travel. Has negative history of Covid. Has not completed Covid vaccines. Has not taken OTC medications for this. There are no aggravating or alleviating factors. Denies previous symptoms in the past. Denies fever,  sinus pain, sore throat, SOB, wheezing, chest pain, nausea, changes in bowel or bladder habits.    ROS: As per HPI.  All other pertinent ROS negative.     Past Medical History:  Diagnosis Date  . GERD (gastroesophageal reflux disease)   . Hypothyroidism    Past Surgical History:  Procedure Laterality Date  . CERVICAL FUSION  08/07/2016   Dr. Channing Mutters  . OVARY SURGERY  1998   [facility]  . TONSILLECTOMY     age 32   Allergies  Allergen Reactions  . Tape   . Iohexol Itching and Rash    Desc: Patient began to itch after receiving omnipaque 300. Was given 25 mg benadryl and felt better., Onset Date: [id number]    No current facility-administered medications on file prior to encounter.   Current Outpatient Medications on File Prior to Encounter  Medication Sig Dispense Refill  . amitriptyline (ELAVIL) 50 MG tablet Take 50 mg by mouth at bedtime.    . Cholecalciferol (VITAMIN D) 50 MCG (2000 UT) tablet Take 2,000 Units by mouth daily.    Marland Kitchen eletriptan (RELPAX) 20 MG tablet Take 20 mg by mouth as needed for migraine or headache. May repeat in 2 hours if headache persists or recurs.    Marland Kitchen levothyroxine (SYNTHROID) 50 MCG tablet TAKE 1 TABLET BY MOUTH AS DIRECTED 102 tablet 1  . linaclotide (LINZESS) 290 MCG CAPS capsule Take 290 mcg by mouth daily.    . metoprolol tartrate (LOPRESSOR) 25 MG tablet Take 0.5 tablet by mouth (12.5 mg) daily.    Marland Kitchen topiramate  (TOPAMAX) 100 MG tablet Take 100 mg by mouth daily.    . vitamin B-12 (CYANOCOBALAMIN) 1000 MCG tablet Take 1,000 mcg by mouth daily.     Social History   Socioeconomic History  . Marital status: Married    Spouse name: Not on file  . Number of children: Not on file  . Years of education: Not on file  . Highest education level: Not on file  Occupational History  . Not on file  Tobacco Use  . Smoking status: Never Smoker  . Smokeless tobacco: Never Used  Substance and Sexual Activity  . Alcohol use: Never    Alcohol/week: 0.0 standard drinks  . Drug use: Never  . Sexual activity: Not on file  Other Topics Concern  . Not on file  Social History Narrative  . Not on file   Social Determinants of Health   Financial Resource Strain: Not on file  Food Insecurity: Not on file  Transportation Needs: Not on file  Physical Activity: Not on file  Stress: Not on file  Social Connections: Not on file  Intimate Partner Violence: Not on file   Family History  Problem Relation Age of Onset  . COPD Mother   . Dementia Mother   . Cirrhosis Mother   . Asthma Mother   . Hypothyroidism Mother   . Stroke Mother   .  Breast cancer Mother 49  . Hypertension Father   . Heart attack Father   . Stroke Father     OBJECTIVE:  Vitals:   05/05/20 0817 05/05/20 0818  BP: 125/83   Pulse: (!) 101   Resp: 18   Temp: 98.3 F (36.8 C)   TempSrc: Oral   SpO2: 97%   Weight:  120 lb (54.4 kg)  Height:  5' (1.524 m)     General appearance: alert; appears fatigued, but nontoxic; speaking in full sentences and tolerating own secretions HEENT: NCAT; Ears: EACs clear, TMs pearly gray; Eyes: PERRL.  EOM grossly intact. Sinuses: nontender; Nose: nares patent with clear rhinorrhea, Throat: oropharynx erythematous, cobblestoning present, tonsils non erythematous or enlarged, uvula midline  Neck: supple without LAD Lungs: unlabored respirations, symmetrical air entry; cough: mild; no respiratory  distress; CTAB Heart: regular rate and rhythm.  Radial pulses 2+ symmetrical bilaterally Skin: warm and dry Psychological: alert and cooperative; normal mood and affect  LABS:  No results found for this or any previous visit (from the past 24 hour(s)).   ASSESSMENT & PLAN:  1. Viral illness   2. Fever, unspecified   3. Cough   4. Other fatigue   5. Nausea   6. Diarrhea, unspecified type     Meds ordered this encounter  Medications  . ondansetron (ZOFRAN) 4 MG tablet    Sig: Take 1 tablet (4 mg total) by mouth every 6 (six) hours.    Dispense:  12 tablet    Refill:  0    Order Specific Question:   Supervising Provider    Answer:   Merrilee Jansky [id number]  . benzonatate (TESSALON) 100 MG capsule    Sig: Take 1 capsule (100 mg total) by mouth every 8 (eight) hours.    Dispense:  21 capsule    Refill:  0    Order Specific Question:   Supervising Provider    Answer:   Merrilee Jansky [[id number]]   Prescribed tessalon perles Prescribed zofran Continue supportive care at home COVID and flu testing ordered.  It will take between 2-3 days for test results. Someone will contact you regarding abnormal results.   Work note provided Patient should remain in quarantine until they have received Covid results.  If negative you may resume normal activities (go back to work/school) while practicing hand hygiene, social distance, and mask wearing.  If positive, patient should remain in quarantine for at least 5 days from symptom onset AND greater than 72 hours after symptoms resolution (absence of fever without the use of fever-reducing medication and improvement in respiratory symptoms), whichever is longer Get plenty of rest and push fluids Use OTC zyrtec for nasal congestion, runny nose, and/or sore throat Use OTC flonase for nasal congestion and runny nose Use medications daily for symptom relief Use OTC medications like ibuprofen or tylenol as needed fever or pain Call or go to the  ED if you have any new or worsening symptoms such as fever, worsening cough, shortness of breath, chest tightness, chest pain, turning blue, changes in mental status.  Reviewed expectations re: course of current medical issues. Questions answered. Outlined signs and symptoms indicating need for more acute intervention. Patient verbalized understanding. After Visit Summary given.         Moshe Cipro, NP 05/05/20 [phone]

## 2020-05-05 NOTE — ED Triage Notes (Signed)
Vomiting since Tuesday night.  Feeling achky on Wednesday.  Off and on fever and congestion

## 2020-05-08 LAB — COVID-19, FLU A+B NAA
Influenza A, NAA: NOT DETECTED
Influenza B, NAA: NOT DETECTED
SARS-CoV-2, NAA: DETECTED — AB

## 2020-05-29 ENCOUNTER — Ambulatory Visit: Payer: BC Managed Care – PPO | Admitting: Cardiology

## 2020-06-01 ENCOUNTER — Other Ambulatory Visit: Payer: Self-pay | Admitting: Internal Medicine

## 2020-06-04 MED ORDER — LEVOTHYROXINE SODIUM 50 MCG PO TABS
ORAL_TABLET | ORAL | 1 refills | Status: DC
Start: 2020-06-04 — End: 2021-01-16

## 2020-06-04 NOTE — Addendum Note (Signed)
Addended by: Tawnya Crook on: 06/04/2020 10:16 AM   Modules accepted: Orders

## 2020-06-05 ENCOUNTER — Encounter: Payer: Self-pay | Admitting: Emergency Medicine

## 2020-06-05 ENCOUNTER — Ambulatory Visit
Admission: EM | Admit: 2020-06-05 | Discharge: 2020-06-05 | Disposition: A | Discharge status: Home / Self Care | Payer: BC Managed Care – PPO | Attending: Emergency Medicine | Admitting: Emergency Medicine

## 2020-06-05 ENCOUNTER — Other Ambulatory Visit: Payer: Self-pay

## 2020-06-05 DIAGNOSIS — U099 Post covid-19 condition, unspecified: Secondary | ICD-10-CM

## 2020-06-05 DIAGNOSIS — H6593 Unspecified nonsuppurative otitis media, bilateral: Secondary | ICD-10-CM

## 2020-06-05 DIAGNOSIS — J0100 Acute maxillary sinusitis, unspecified: Secondary | ICD-10-CM | POA: Diagnosis not present

## 2020-06-05 DIAGNOSIS — R5383 Other fatigue: Secondary | ICD-10-CM | POA: Diagnosis not present

## 2020-06-05 MED ORDER — CETIRIZINE HCL 10 MG PO TABS: 10.0000 mg | ORAL_TABLET | Freq: Every day | ORAL | 0 refills | Status: DC

## 2020-06-05 MED ORDER — BENZONATATE 100 MG PO CAPS: 100.0000 mg | ORAL_CAPSULE | Freq: Three times a day (TID) | ORAL | 0 refills | Status: DC | PRN

## 2020-06-05 MED ORDER — FLUTICASONE PROPIONATE 50 MCG/ACT NA SUSP: 1.0000 | Freq: Every day | NASAL | 0 refills | Status: DC

## 2020-06-05 MED ORDER — AMOXICILLIN-POT CLAVULANATE 875-125 MG PO TABS: 1.0000 | ORAL_TABLET | Freq: Two times a day (BID) | ORAL | 0 refills | Status: DC

## 2020-06-05 MED ORDER — PREDNISONE 10 MG PO TABS: 20.0000 mg | ORAL_TABLET | Freq: Every day | ORAL | 0 refills | Status: DC

## 2020-06-05 NOTE — ED Provider Notes (Signed)
[facility]   [id number] 06/05/20 Arrival Time: 1017   CC: URI  SUBJECTIVE: History from: patient.  Tracie Ross is a 60 y.o. female who presented to the urgent care for complaint of fatigue, nasal congestion, sneezing and ear fullness for the past 4 days.  Denies sick exposure to  flu or strep.  Report recent Covid infection.  Denies recent travel.  Has tried OTC medication without relief.  Denies alleviating or aggravating factors.  Denies previous symptoms in the past.   Denies fever,  sinus pain, rhinorrhea, sore throat, SOB, wheezing, chest pain, nausea, changes in bowel or bladder habits.     ROS: As per HPI.  All other pertinent ROS negative.      Past Medical History:  Diagnosis Date  . GERD (gastroesophageal reflux disease)   . Hypothyroidism    Past Surgical History:  Procedure Laterality Date  . CERVICAL FUSION  08/07/2016   Dr. Channing Mutters  . [facility]GERY  1998   [facility]  . TONSILLECTOMY     age 89   Allergies  Allergen Reactions  . Tape   . Iohexol Itching and Rash    Desc: Patient began to itch after receiving omnipaque 300. Was given 25 mg benadryl and felt better., Onset Date: [id number]    No current facility-administered medications on file prior to encounter.   Current Outpatient Medications on File Prior to Encounter  Medication Sig Dispense Refill  . amitriptyline (ELAVIL) 50 MG tablet Take 50 mg by mouth at bedtime.    . Cholecalciferol (VITAMIN D) 50 MCG (2000 UT) tablet Take 2,000 Units by mouth daily.    Marland Kitchen eletriptan (RELPAX) 20 MG tablet Take 20 mg by mouth as needed for migraine or headache. May repeat in 2 hours if headache persists or recurs.    Marland Kitchen levothyroxine (SYNTHROID) 50 MCG tablet Taking 1 tablet on Monday to Saturday. Taking 2 tablets on Sunday 96 tablet 1  . linaclotide (LINZESS) 290 MCG CAPS capsule Take 290 mcg by mouth daily.    . metoprolol tartrate (LOPRESSOR) 25 MG tablet Take 0.5 tablet by mouth (12.5 mg) daily.    .  ondansetron (ZOFRAN) 4 MG tablet Take 1 tablet (4 mg total) by mouth every 6 (six) hours. 12 tablet 0  . topiramate (TOPAMAX) 100 MG tablet Take 100 mg by mouth daily.    . vitamin B-12 (CYANOCOBALAMIN) 1000 MCG tablet Take 1,000 mcg by mouth daily.     Social History   Socioeconomic History  . Marital status: Married    Spouse name: Not on file  . Number of children: Not on file  . Years of education: Not on file  . Highest education level: Not on file  Occupational History  . Not on file  Tobacco Use  . Smoking status: Never Smoker  . Smokeless tobacco: Never Used  Substance and Sexual Activity  . Alcohol use: Never    Alcohol/week: 0.0 standard drinks  . Drug use: Never  . Sexual activity: Not on file  Other Topics Concern  . Not on file  Social History Narrative  . Not on file   Social Determinants of Health   Financial Resource Strain: Not on file  Food Insecurity: Not on file  Transportation Needs: Not on file  Physical Activity: Not on file  Stress: Not on file  Social Connections: Not on file  Intimate Partner Violence: Not on file   Family History  Problem Relation Age of Onset  . COPD  Mother   . Dementia Mother   . Cirrhosis Mother   . Asthma Mother   . Hypothyroidism Mother   . Stroke Mother   . Breast cancer Mother 18  . Hypertension Father   . Heart attack Father   . Stroke Father     OBJECTIVE:  Vitals:   06/05/20 1027 06/05/20 1028  BP:  112/78  Pulse:  97  Resp:  17  Temp:  98 F (36.7 C)  TempSrc:  Oral  SpO2:  96%  Weight: 120 lb (54.4 kg)   Height: 5\' 1"  (1.549 m)      General appearance: alert; appears fatigued, but nontoxic; speaking in full sentences and tolerating own secretions HEENT: NCAT; Ears: EACs clear, bilateral TM with middle ear effusion.  EOM grossly intact. Sinuses: Right sinus tender; Nose: nares patent without rhinorrhea, Throat: oropharynx clear, tonsils non erythematous or enlarged, uvula midline  Neck: supple  without LAD Lungs: unlabored respirations, symmetrical air entry; cough: moderate; no respiratory distress; CTAB Heart: regular rate and rhythm.  Radial pulses 2+ symmetrical bilaterally Skin: warm and dry Psychological: alert and cooperative; normal mood and affect  LABS:  No results found for this or any previous visit (from the past 24 hour(s)).   ASSESSMENT & PLAN:  1. Other fatigue   2. 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 Acute non-recurrent maxillary sinusitis     Meds ordered this encounter  Medications  . benzonatate (TESSALON) 100 MG capsule    Sig: Take 1 capsule (100 mg total) by mouth 3 (three) times daily as needed for cough.    Dispense:  30 capsule    Refill:  0  . fluticasone (FLONASE) 50 MCG/ACT nasal spray    Sig: Place 1 spray into both nostrils daily for 14 days.    Dispense:  16 g    Refill:  0  . amoxicillin-clavulanate (AUGMENTIN) 875-125 MG tablet    Sig: Take 1 tablet by mouth every 12 (twelve) hours.    Dispense:  14 tablet    Refill:  0  . cetirizine (ZYRTEC ALLERGY) 10 MG tablet    Sig: Take 1 tablet (10 mg total) by mouth daily.    Dispense:  30 tablet    Refill:  0    Discharge Instructions  Get plenty of rest and push fluids Tessalon Perles prescribed for cough Zyrtec for nasal congestion, runny nose, and/or sore throat flonase for nasal congestion and runny nose Prednisone was prescribed Augmentin was prescribed Use medications daily for symptom relief Use OTC medications like ibuprofen or tylenol as needed fever or pain Call or go to the ED if you have any new or worsening symptoms such as fever, worsening cough, shortness of breath, chest tightness, chest pain, turning blue, changes in mental status, etc...   Reviewed expectations re: course of current medical issues. Questions answered. Outlined signs and symptoms indicating need for more acute intervention. Patient verbalized understanding. After  Visit Summary given.         , FNP 06/05/20 1122

## 2020-06-05 NOTE — ED Triage Notes (Signed)
Fatigue, sneezing, nasal congestion, ears are popping since Friday evening.  Pt had covid on 05/05/2020 and states she never fully felt better since then.

## 2020-06-05 NOTE — Discharge Instructions (Addendum)
Get plenty of rest and push fluids Tessalon Perles prescribed for cough Zyrtec for nasal congestion, runny nose, and/or sore throat flonase for nasal congestion and runny nose Prednisone was prescribed Augmentin was prescribed Use medications daily for symptom relief Use OTC medications like ibuprofen or tylenol as needed fever or pain Call or go to the ED if you have any new or worsening symptoms such as fever, worsening cough, shortness of breath, chest tightness, chest pain, turning blue, changes in mental status, etc..Marland Kitchen

## 2020-06-26 ENCOUNTER — Other Ambulatory Visit: Payer: Self-pay | Admitting: Family Medicine

## 2020-06-26 DIAGNOSIS — Z1231 Encounter for screening mammogram for malignant neoplasm of breast: Secondary | ICD-10-CM

## 2020-08-06 ENCOUNTER — Encounter: Payer: Self-pay | Admitting: Cardiology

## 2020-08-06 ENCOUNTER — Ambulatory Visit (INDEPENDENT_AMBULATORY_CARE_PROVIDER_SITE_OTHER): Payer: BC Managed Care – PPO | Admitting: Cardiology

## 2020-08-06 VITALS — BP 120/80 | HR 104 | Ht 60.0 in | Wt 124.0 lb

## 2020-08-06 DIAGNOSIS — R002 Palpitations: Secondary | ICD-10-CM

## 2020-08-06 DIAGNOSIS — R0789 Other chest pain: Secondary | ICD-10-CM

## 2020-08-06 MED ORDER — METOPROLOL TARTRATE 25 MG PO TABS: 12.5000 mg | ORAL_TABLET | Freq: Two times a day (BID) | ORAL | 1 refills | Status: DC

## 2020-08-06 NOTE — Progress Notes (Signed)
Clinical Summary Tracie Ross is a 60 y.o.female former patient of Dr Purvis Sheffield, this is our first visit together. Seen for the following medical problems.  1. Chest pain - no recent symptoms   2. Tachycardia/palpitatins Event monitoring in October 2018 demonstrated paroxysms of sinus tachycardia with rates as high as 148 bpm.   - infrequent palpitations, though notices some high heart rates at times.  - has been taking her lopressor just prn as opposed to daily.   3. Hypothyroidism - followed by endocrine.   4. COVID + Jan 2022 Past Medical History:  Diagnosis Date  . GERD (gastroesophageal reflux disease)   . Hypothyroidism      Allergies  Allergen Reactions  . Tape   . Iohexol Itching and Rash    Desc: Patient began to itch after receiving omnipaque 300. Was given 25 mg benadryl and felt better., Onset Date: [id number]      Current Outpatient Medications  Medication Sig Dispense Refill  . amitriptyline (ELAVIL) 50 MG tablet Take 50 mg by mouth at bedtime.    Marland Kitchen amoxicillin-clavulanate (AUGMENTIN) 875-125 MG tablet Take 1 tablet by mouth every 12 (twelve) hours. 14 tablet 0  . benzonatate (TESSALON) 100 MG capsule Take 1 capsule (100 mg total) by mouth 3 (three) times daily as needed for cough. 30 capsule 0  . cetirizine (ZYRTEC ALLERGY) 10 MG tablet Take 1 tablet (10 mg total) by mouth daily. 30 tablet 0  . Cholecalciferol (VITAMIN D) 50 MCG (2000 UT) tablet Take 2,000 Units by mouth daily.    Marland Kitchen eletriptan (RELPAX) 20 MG tablet Take 20 mg by mouth as needed for migraine or headache. May repeat in 2 hours if headache persists or recurs.    . fluticasone (FLONASE) 50 MCG/ACT nasal spray Place 1 spray into both nostrils daily for 14 days. 16 g 0  . levothyroxine (SYNTHROID) 50 MCG tablet Taking 1 tablet on Monday to Saturday. Taking 2 tablets on Sunday 96 tablet 1  . linaclotide (LINZESS) 290 MCG CAPS capsule Take 290 mcg by mouth daily.    . metoprolol tartrate  (LOPRESSOR) 25 MG tablet Take 0.5 tablet by mouth (12.5 mg) daily.    . ondansetron (ZOFRAN) 4 MG tablet Take 1 tablet (4 mg total) by mouth every 6 (six) hours. 12 tablet 0  . predniSONE (DELTASONE) 10 MG tablet Take 2 tablets (20 mg total) by mouth daily. 15 tablet 0  . topiramate (TOPAMAX) 100 MG tablet Take 100 mg by mouth daily.    . vitamin B-12 (CYANOCOBALAMIN) 1000 MCG tablet Take 1,000 mcg by mouth daily.     No current facility-administered medications for this visit.     Past Surgical History:  Procedure Laterality Date  . CERVICAL FUSION  08/07/2016   Dr. Channing Mutters  . [facility]GERY  [facility]   [facility]  . TONSILLECTOMY     age 31     Allergies  Allergen Reactions  . Tape   . Iohexol Itching and Rash    Desc: Patient began to itch after receiving omnipaque 300. Was given 25 mg benadryl and felt better., Onset Date: [id number]       Family History  Problem Relation Age of Onset  . COPD Mother   . Dementia Mother   . Cirrhosis Mother   . Asthma Mother   . Hypothyroidism Mother   . Stroke Mother   . Breast cancer Mother 54  . Hypertension Father   . Heart attack Father   .  Stroke Father      Social History Tracie Ross reports that she has never smoked. She has never used smokeless tobacco. Tracie Ross reports no history of alcohol use.   Review of Systems CONSTITUTIONAL: No weight loss, fever, chills, weakness or fatigue.  HEENT: Eyes: No visual loss, blurred vision, double vision or yellow sclerae.No hearing loss, sneezing, congestion, runny nose or sore throat.  SKIN: No rash or itching.  CARDIOVASCULAR: per hpi RESPIRATORY: No shortness of breath, cough or sputum.  GASTROINTESTINAL: No anorexia, nausea, vomiting or diarrhea. No abdominal pain or blood.  GENITOURINARY: No burning on urination, no polyuria NEUROLOGICAL: No headache, dizziness, syncope, paralysis, ataxia, numbness or tingling in the extremities. No change in bowel or bladder control.   MUSCULOSKELETAL: No muscle, back pain, joint pain or stiffness.  LYMPHATICS: No enlarged nodes. No history of splenectomy.  PSYCHIATRIC: No history of depression or anxiety.  ENDOCRINOLOGIC: No reports of sweating, cold or heat intolerance. No polyuria or polydipsia.  Marland Kitchen   Physical Examination Today's Vitals   08/06/20 1321  BP: 120/80  Pulse: (!) 104  SpO2: 98%  Weight: 124 lb (56.2 kg)  Height: 5' (1.524 m)   Body mass index is 24.22 kg/m.  Gen: resting comfortably, no acute distress HEENT: no scleral icterus, pupils equal round and reactive, no palptable cervical adenopathy,  CV RRR, n m/r/g no jvd Resp: Clear to auscultation bilaterally GI: abdomen is soft, non-tender, non-distended, normal bowel sounds, no hepatosplenomegaly MSK: extremities are warm, no edema.  Skin: warm, no rash Neuro:  no focal deficits Psych: appropriate affect   Diagnostic Studies 12/2016 echo Study Conclusions   - Left ventricle: The cavity size was normal. Wall thickness was  normal. Systolic function was normal. The estimated ejection  fraction was in the range of 55% to 60%. Wall motion was normal;  there were no regional wall motion abnormalities. Left  ventricular diastolic function parameters were normal.  - Aortic valve: Mildly calcified annulus. Trileaflet.  - Mitral valve: There was trivial regurgitation.  - Right atrium: Central venous pressure (est): 3 mm Hg.  - Atrial septum: No defect or patent foramen ovale was identified.  - Tricuspid valve: There was trivial regurgitation.  - Pericardium, extracardiac: There was no pericardial effusion.   Impressions:   - Normal LV wall thickness with LVEF 55-60% and normal diastolic  function. Trivial mitral regurgitation. Mildly calcified aortic  annulus. Trivial tricuspid regurgitation.   01/2017 heart monitor  Patient unable to wear monitor for entire period of time.  Paroxysms of sinus tachycardia seen with rates as  high as 148 bpm.      Assessment and Plan  1. Palpitations - occasional symptoms and high heart rates - will have her starting taking her lopressor 12.5mg  bid, she had been just taking prn on her own  2. Chest pain - no recent symptoms, continue to monitor.      Antoine Poche, M.D.

## 2020-08-06 NOTE — Patient Instructions (Signed)
Your physician wants you to follow-up in: 1 YEAR WITH DR [facility] You will receive a reminder letter in the mail two months in advance. If you don't receive a letter, please call our office to schedule the follow-up appointment.  Your physician has recommended you make the following change in your medication:   CHANGE LOPRESSOR 12.5 (1/2 TABLET) TWICE DAILY   Thank you for choosing [facility]!!

## 2020-08-07 ENCOUNTER — Encounter: Payer: Self-pay | Admitting: *Deleted

## 2020-08-16 ENCOUNTER — Ambulatory Visit
Admission: RE | Admit: 2020-08-16 | Discharge: 2020-08-16 | Disposition: A | Discharge status: Home / Self Care | Payer: BC Managed Care – PPO | Source: Ambulatory Visit | Attending: Family Medicine | Admitting: Family Medicine

## 2020-08-16 ENCOUNTER — Other Ambulatory Visit: Payer: Self-pay

## 2020-08-16 DIAGNOSIS — Z1231 Encounter for screening mammogram for malignant neoplasm of breast: Secondary | ICD-10-CM

## 2020-08-31 ENCOUNTER — Ambulatory Visit: Payer: BC Managed Care – PPO | Admitting: Internal Medicine

## 2020-09-12 ENCOUNTER — Ambulatory Visit: Payer: BC Managed Care – PPO | Admitting: Internal Medicine

## 2020-09-18 ENCOUNTER — Other Ambulatory Visit: Payer: Self-pay

## 2020-09-18 ENCOUNTER — Ambulatory Visit
Admission: EM | Admit: 2020-09-18 | Discharge: 2020-09-18 | Disposition: A | Discharge status: Home / Self Care | Payer: BC Managed Care – PPO | Attending: Family Medicine | Admitting: Family Medicine

## 2020-09-18 DIAGNOSIS — J069 Acute upper respiratory infection, unspecified: Secondary | ICD-10-CM

## 2020-09-18 DIAGNOSIS — J209 Acute bronchitis, unspecified: Secondary | ICD-10-CM

## 2020-09-18 MED ORDER — GUAIFENESIN-CODEINE 100-10 MG/5ML PO SYRP: 5.0000 mL | ORAL_SOLUTION | Freq: Three times a day (TID) | ORAL | 0 refills | Status: DC | PRN

## 2020-09-18 MED ORDER — AZITHROMYCIN 250 MG PO TABS: 250.0000 mg | ORAL_TABLET | Freq: Every day | ORAL | 0 refills | Status: DC

## 2020-09-18 MED ORDER — PREDNISONE 10 MG (21) PO TBPK: ORAL_TABLET | Freq: Every day | ORAL | 0 refills | Status: AC

## 2020-09-18 NOTE — ED Provider Notes (Signed)
[facility]    CSN: [id number] Arrival date & time: 09/18/20  0859      History   Chief Complaint Chief Complaint  Patient presents with  . Cough    HPI Tracie Ross is a 60 y.o. female.   Reports cough and bilateral ear pressure for the last week.  States that she is having a sore throat and vomiting due to the cough.  Has not attempted OTC treatment.  Denies sick contacts.  Has positive history of COVID.  Has not completed COVID vaccines.  Has not completed flu vaccine.  Denies abdominal pain, shortness of breath, nausea, diarrhea, rash, fever, other symptoms.  ROS per HPI  The history is provided by the patient.  Cough   Past Medical History:  Diagnosis Date  . GERD (gastroesophageal reflux disease)   . Hypothyroidism     Patient Active Problem List   Diagnosis Date Noted  . Acquired hypothyroidism 02/28/2019    Past Surgical History:  Procedure Laterality Date  . CERVICAL FUSION  08/07/2016   Dr. Channing Mutters  . [facility]ARY SURGERY  1998   [facility]  . TONSILLECTOMY     age 46    OB History   No obstetric history on file.      Home Medications    Prior to Admission medications   Medication Sig Start Date End Date Taking? Authorizing Provider  azithromycin (ZITHROMAX) 250 MG tablet Take 1 tablet (250 mg total) by mouth daily. Take first 2 tablets together, then 1 every Ross until finished. 09/18/20  Yes Moshe Cipro, NP  guaiFENesin-codeine (ROBITUSSIN AC) 100-10 MG/5ML syrup Take 5 mLs by mouth 3 (three) times daily as needed for cough. 09/18/20  Yes Moshe Cipro, NP  predniSONE (STERAPRED UNI-PAK 21 TAB) 10 MG (21) TBPK tablet Take by mouth daily for 6 days. Take 6 tablets on Ross 1, 5 tablets on Ross 2, 4 tablets on Ross 3, 3 tablets on Ross 4, 2 tablets on Ross 5, 1 tablet on Ross 6 09/18/20 09/24/20 Yes Moshe Cipro, NP  amitriptyline (ELAVIL) 50 MG tablet Take 50 mg by mouth at bedtime.    [provider]  Cholecalciferol (VITAMIN D)  50 MCG (2000 UT) tablet Take 2,000 Units by mouth daily.    [provider]  eletriptan (RELPAX) 20 MG tablet Take 20 mg by mouth as needed for migraine or headache. May repeat in 2 hours if headache persists or recurs.    [provider]  fluticasone (FLONASE) 50 MCG/ACT nasal spray Place 2 sprays into both nostrils daily.    [provider]  levothyroxine (SYNTHROID) 50 MCG tablet Taking 1 tablet on Monday to Saturday. Taking 2 tablets on Sunday 06/04/20   Shamleffer, Konrad Dolores, MD  linaclotide [facility]) 290 MCG CAPS capsule Take 290 mcg by mouth daily.    [provider]  metoprolol tartrate (LOPRESSOR) 25 MG tablet Take 0.5 tablets (12.5 mg total) by mouth 2 (two) times daily. 08/06/20   Antoine Poche, MD  pantoprazole (PROTONIX) 40 MG tablet Take 1 tablet by mouth daily. 07/18/20   [provider]  topiramate (TOPAMAX) 100 MG tablet Take 100 mg by mouth daily.    [provider]    Family History Family History  Problem Relation Age of Onset  . COPD Mother   . Dementia Mother   . Cirrhosis Mother   . Asthma Mother   . Hypothyroidism Mother   . Stroke Mother   . Breast cancer Mother  70  . Hypertension Father   . Heart attack Father   . Stroke Father     Social History Social History   Tobacco Use  . Smoking status: Never Smoker  . Smokeless tobacco: Never Used  Substance Use Topics  . Alcohol use: Never    Alcohol/week: 0.0 standard drinks  . Drug use: Never     Allergies   Tape and Iohexol   Review of Systems Review of Systems  Respiratory: Positive for cough.      Physical Exam Triage Vital Signs ED Triage Vitals  Enc Vitals Group     BP 09/18/20 1007 124/87     Pulse Rate 09/18/20 1007 (!) 108     Resp 09/18/20 1007 17     Temp 09/18/20 1007 98.1 F (36.7 C)     Temp Source 09/18/20 1007 Oral     SpO2 09/18/20 1007 98 %     Weight --      Height --      Head Circumference --      Peak  Flow --      Pain Score 09/18/20 1009 6     Pain Loc --      Pain Edu? --      Excl. in GC? --    No data found.  Updated Vital Signs BP 124/87 (BP Location: Right Arm)   Pulse (!) 108 Comment: Pt states she has not taken her meds for her heart rate.  Temp 98.1 F (36.7 C) (Oral)   Resp 17   SpO2 98%      Physical Exam Vitals and nursing note reviewed.  Constitutional:      General: She is not in acute distress.    Appearance: She is well-developed and normal weight. She is ill-appearing.  HENT:     Head: Normocephalic and atraumatic.     Right Ear: Tympanic membrane, ear canal and external ear normal.     Left Ear: Tympanic membrane, ear canal and external ear normal.     Nose: Congestion present.     Mouth/Throat:     Mouth: Mucous membranes are moist.     Pharynx: Posterior oropharyngeal erythema present.  Eyes:     Extraocular Movements: Extraocular movements intact.     Conjunctiva/sclera: Conjunctivae normal.     Pupils: Pupils are equal, round, and reactive to light.  Cardiovascular:     Rate and Rhythm: Normal rate and regular rhythm.     Heart sounds: Normal heart sounds. No murmur heard.   Pulmonary:     Effort: Pulmonary effort is normal. No respiratory distress.     Breath sounds: Normal breath sounds. No stridor. No wheezing, rhonchi or rales.     Comments: Moderate dry cough in office  Chest:     Chest wall: No tenderness.  Abdominal:     General: Bowel sounds are normal.     Palpations: Abdomen is soft.     Tenderness: There is no abdominal tenderness.  Musculoskeletal:        General: Normal range of motion.     Cervical back: Normal range of motion and neck supple.  Lymphadenopathy:     Cervical: Cervical adenopathy present.  Skin:    General: Skin is warm and dry.     Capillary Refill: Capillary refill takes less than 2 seconds.  Neurological:     General: No focal deficit present.     Mental Status: She is alert and oriented to person,  place,  and time.  Psychiatric:        Mood and Affect: Mood normal.        Behavior: Behavior normal.        Thought Content: Thought content normal.      UC Treatments / Results  Labs (all labs ordered are listed, but only abnormal results are displayed) Labs Reviewed - No data to display  EKG   Radiology No results found.  Procedures Procedures (including critical care time)  Medications Ordered in UC Medications - No data to display  Initial Impression / Assessment and Plan / UC Course  I have reviewed the triage vital signs and the nursing notes.  Pertinent labs & imaging results that were available during my care of the patient were reviewed by me and considered in my medical decision making (see chart for details).    URI with cough and congestion Acute bronchitis  Prescribed azithromycin Prescribe steroid taper Take as directed and to completion Cheratussin cough syrup prescribed Sedation precautions given Declines COVID and flu testing today Follow up with this office or with primary care if symptoms are persisting.  Follow up in the ER for high fever, trouble swallowing, trouble breathing, other concerning symptoms.   Final Clinical Impressions(s) / UC Diagnoses   Final diagnoses:  URI with cough and congestion  Acute bronchitis, unspecified organism     Discharge Instructions     I have sent in azithromycin for you to take. Take 2 tablets today, then one tablet daily for the next 4 days.  I have sent in a prednisone taper for you to take for 6 days. 6 tablets on Ross one, 5 tablets on Ross two, 4 tablets on Ross three, 3 tablets on Ross four, 2 tablets on Ross five, and 1 tablet on Ross six.  I have sent in cough syrup for you to take. This medication can make you sleepy. Do not drive while taking this medication.  Follow up with this office or with primary care if symptoms are persisting.  Follow up in the ER for high fever, trouble swallowing,  trouble breathing, other concerning symptoms.     ED Prescriptions    Medication Sig Dispense Auth. Provider   azithromycin (ZITHROMAX) 250 MG tablet Take 1 tablet (250 mg total) by mouth daily. Take first 2 tablets together, then 1 every Ross until finished. 6 tablet Moshe Cipro, NP   predniSONE (STERAPRED UNI-PAK 21 TAB) 10 MG (21) TBPK tablet Take by mouth daily for 6 days. Take 6 tablets on Ross 1, 5 tablets on Ross 2, 4 tablets on Ross 3, 3 tablets on Ross 4, 2 tablets on Ross 5, 1 tablet on Ross 6 21 tablet Moshe Cipro, NP   guaiFENesin-codeine (ROBITUSSIN AC) 100-10 MG/5ML syrup Take 5 mLs by mouth 3 (three) times daily as needed for cough. 120 mL Moshe Cipro, NP     PDMP not reviewed this encounter.   Moshe Cipro, NP 09/23/20 [phone]

## 2020-09-18 NOTE — Discharge Instructions (Addendum)
I have sent in azithromycin for you to take. Take 2 tablets today, then one tablet daily for the next 4 days.  I have sent in a prednisone taper for you to take for 6 days. 6 tablets on day one, 5 tablets on day two, 4 tablets on day three, 3 tablets on day four, 2 tablets on day five, and 1 tablet on day six.  I have sent in cough syrup for you to take. This medication can make you sleepy. Do not drive while taking this medication.  Follow up with this office or with primary care if symptoms are persisting.  Follow up in the ER for high fever, trouble swallowing, trouble breathing, other concerning symptoms.  

## 2020-09-18 NOTE — ED Triage Notes (Signed)
Pt reports cough and bilateral ear pressure :like fluid bill up x 1 week. Pt reports sore throat and vomiting due to the cough. Denies fever.  Pt states she do not feel this is COVID as she had COVID in January 2022.

## 2020-10-22 ENCOUNTER — Encounter: Payer: Self-pay | Admitting: Internal Medicine

## 2020-10-22 ENCOUNTER — Other Ambulatory Visit: Payer: Self-pay

## 2020-10-22 ENCOUNTER — Ambulatory Visit: Payer: BC Managed Care – PPO | Admitting: Internal Medicine

## 2020-10-22 VITALS — BP 120/86 | HR 90 | Ht 61.0 in | Wt 120.0 lb

## 2020-10-22 DIAGNOSIS — E673 Hypervitaminosis D: Secondary | ICD-10-CM | POA: Diagnosis not present

## 2020-10-22 DIAGNOSIS — R5383 Other fatigue: Secondary | ICD-10-CM | POA: Diagnosis not present

## 2020-10-22 DIAGNOSIS — E039 Hypothyroidism, unspecified: Secondary | ICD-10-CM | POA: Diagnosis not present

## 2020-10-22 LAB — COMPREHENSIVE METABOLIC PANEL
ALT: 11 U/L (ref 0–35)
AST: 19 U/L (ref 0–37)
Albumin: 4.6 g/dL (ref 3.5–5.2)
Alkaline Phosphatase: 106 U/L (ref 39–117)
BUN: 9 mg/dL (ref 6–23)
CO2: 26 mEq/L (ref 19–32)
Calcium: 9.9 mg/dL (ref 8.4–10.5)
Chloride: 106 mEq/L (ref 96–112)
Creatinine, Ser: 0.9 mg/dL (ref 0.40–1.20)
GFR: 70.41 mL/min (ref >60.00)
Glucose, Bld: 93 mg/dL (ref 70–99)
Potassium: 4.2 mEq/L (ref 3.5–5.1)
Sodium: 140 mEq/L (ref 135–145)
Total Bilirubin: 0.5 mg/dL (ref 0.2–1.2)
Total Protein: 7.4 g/dL (ref 6.0–8.3)

## 2020-10-22 LAB — CBC
HCT: 42.8 % (ref 36.0–46.0)
Hemoglobin: 14.2 g/dL (ref 12.0–15.0)
MCHC: 33.2 g/dL (ref 30.0–36.0)
MCV: 85.8 fl (ref 78.0–100.0)
Platelets: 395 10*3/uL (ref 150.0–400.0)
RBC: 4.99 Mil/uL (ref 3.87–5.11)
RDW: 13.9 % (ref 11.5–15.5)
WBC: 9.3 10*3/uL (ref 4.0–10.5)

## 2020-10-22 LAB — VITAMIN B12: Vitamin B-12: 497 pg/mL (ref 211–911)

## 2020-10-22 LAB — TSH: TSH: 0.16 u[IU]/mL — ABNORMAL LOW (ref 0.35–4.50)

## 2020-10-22 NOTE — Progress Notes (Signed)
Name: Tracie Ross [facility]  MRN/ DOB: [id number], [birth year]    Age/ Sex: 60 y.o., female     PCP: Tracie Found, MD   Reason for Endocrinology Evaluation: Hypothyroidism      Initial Endocrinology Clinic Visit: 11/23/2018    PATIENT IDENTIFIER: [name] is a 60 y.o., female with a past medical history of Depression, chronic headaches. She has followed with [facility] since 11/23/2018 for consultative assistance with management of her hypothyroidism.   HISTORICAL SUMMARY: The patient was first diagnosed with hypothyroidism many years ago. Has been on Levothyroxine since her diagnosis. In 2016 she required Levothyroxine 125 mcg daily, but sometime in 2018 she started having extreme fatigue with hair loss and nail deformity and her dose has been gradually  reduced.    SUBJECTIVE:   During last visit (11/23/2018): TSH was low at 0.30 uIU/mL and she was advised to reduce LT-4 replacement to 6 days a week.   Today (10/22/2020):  [name] is here for a follow up on hypothyroidism. She is compliant with LT-4 replacement. She takes appropriately   She is tired and has been sleeping a lot  Had COVID in 04/2020, bronchitis in 08/2020  She is still having hair loss  Takes linzess for chronic constipation   Denies any local neck symptoms except when had bronchitis   No biotin    Levothyroxine 50 mcg , daily except Sunday 2 tabs  Vitamin D 2000 iu daily    HISTORY:  Past Medical History:  Past Medical History:  Diagnosis Date   GERD (gastroesophageal reflux disease)    Hypothyroidism    Past Surgical History:  Past Surgical History:  Procedure Laterality Date   CERVICAL FUSION  08/07/2016   Dr. Channing Mutters   [facility]  [facility]   [facility]   TONSILLECTOMY     age 40   Social History:  reports that she has never smoked. She has never used smokeless tobacco. She reports that she does not drink alcohol and does not use drugs. Family History:  Family History  Problem  Relation Age of Onset   COPD Mother    Dementia Mother    Cirrhosis Mother    Asthma Mother    Hypothyroidism Mother    Stroke Mother    Breast cancer Mother 11   Hypertension Father    Heart attack Father    Stroke Father      HOME MEDICATIONS: Allergies as of 10/22/2020       Reactions   Tape    Iohexol Itching, Rash   Desc: Patient began to itch after receiving omnipaque 300. Was given 25 mg benadryl and felt better., Onset Date: [id number]        Medication List        Accurate as of October 22, 2020  3:28 PM. If you have any questions, ask your nurse or doctor.          STOP taking these medications    azithromycin 250 MG tablet Commonly known as: ZITHROMAX Stopped by: Scarlette Shorts, MD   guaiFENesin-codeine 100-10 MG/5ML syrup Commonly known as: ROBITUSSIN AC Stopped by: Scarlette Shorts, MD       TAKE these medications    amitriptyline 50 MG tablet Commonly known as: ELAVIL Take 50 mg by mouth at bedtime.   eletriptan 20 MG tablet Commonly known as: RELPAX Take 20 mg by mouth as needed for migraine or headache. May repeat in 2 hours if headache persists  or recurs.   fluticasone 50 MCG/ACT nasal spray Commonly known as: FLONASE Place 2 sprays into both nostrils daily.   levothyroxine 50 MCG tablet Commonly known as: SYNTHROID Taking 1 tablet on Monday to Saturday. Taking 2 tablets on Sunday   linaclotide 290 MCG Caps capsule Commonly known as: LINZESS Take 290 mcg by mouth daily.   metoprolol tartrate 25 MG tablet Commonly known as: LOPRESSOR Take 0.5 tablets (12.5 mg total) by mouth 2 (two) times daily.   pantoprazole 40 MG tablet Commonly known as: PROTONIX Take 1 tablet by mouth daily.   topiramate 100 MG tablet Commonly known as: TOPAMAX Take 100 mg by mouth daily.   Vitamin D 50 MCG (2000 UT) tablet Take 2,000 Units by mouth daily.          OBJECTIVE:   PHYSICAL EXAM: VS: BP 120/86   Pulse 90   Ht 5\' 1"   (1.549 m)   Wt 120 lb (54.4 kg)   SpO2 99%   BMI 22.67 kg/m    EXAM: General: Pt appears well and is in NAD  Neck: General: Supple without adenopathy. Thyroid: No goiter or nodules appreciated. No thyroid bruit.  Lungs: Clear with good BS bilat with no rales, rhonchi, or wheezes  Heart: Auscultation: RRR.  Abdomen: Normoactive bowel sounds, soft, nontender, without masses or organomegaly palpable  Extremities:  BL LE: No pretibial edema normal ROM and strength.  Mental Status: Judgment, insight: Intact Orientation: Oriented to time, place, and person Mood and affect: No depression, anxiety, or agitation     DATA REVIEWED:  Results for Tracie Ross (MRN Delsa Bern) as of 10/23/2020 11:35  Ref. Range 10/22/2020 15:44  Sodium Latest Ref Range: 135 - 145 mEq/L 140  Potassium Latest Ref Range: 3.5 - 5.1 mEq/L 4.2  Chloride Latest Ref Range: 96 - 112 mEq/L 106  CO2 Latest Ref Range: 19 - 32 mEq/L 26  Glucose Latest Ref Range: 70 - 99 mg/dL 93  BUN Latest Ref Range: 6 - 23 mg/dL 9  Creatinine Latest Ref Range: 0.40 - 1.20 mg/dL 10/24/2020  Calcium Latest Ref Range: 8.4 - 10.5 mg/dL 9.9  Alkaline Phosphatase Latest Ref Range: 39 - 117 U/L 106  Albumin Latest Ref Range: 3.5 - 5.2 g/dL 4.6  AST Latest Ref Range: 0 - 37 U/L 19  ALT Latest Ref Range: 0 - 35 U/L 11  Total Protein Latest Ref Range: 6.0 - 8.3 g/dL 7.4  Total Bilirubin Latest Ref Range: 0.2 - 1.2 mg/dL 0.5  GFR Latest Ref Range: >60.00 mL/min 70.41  VITD Latest Ref Range: 30.00 - 100.00 ng/mL 108.20 (HH)  Vitamin B12 Latest Ref Range: 211 - 911 pg/mL 497  WBC Latest Ref Range: 4.0 - 10.5 K/uL 9.3  RBC Latest Ref Range: 3.87 - 5.11 Mil/uL 4.99  Hemoglobin Latest Ref Range: 12.0 - 15.0 g/dL 3.29  HCT Latest Ref Range: 36.0 - 46.0 % 42.8  MCV Latest Ref Range: 78.0 - 100.0 fl 85.8  MCHC Latest Ref Range: 30.0 - 36.0 g/dL 92.4  RDW Latest Ref Range: 11.5 - 15.5 % 13.9  Platelets Latest Ref Range: 150.0 - 400.0 K/uL 395.0   TSH Latest Ref Range: 0.35 - 4.50 uIU/mL 0.16 (L)   ASSESSMENT / PLAN / RECOMMENDATIONS:   Hypothyroidism   - Pt with non-specific symptoms that could be attributed to her thyroid  - She has been compliant with LT-4 replacement.  - TSH low, it seems she has variable absorption     Medications  Levothyroxine 50 mcg , daily except sundays takes 1.5 tabs     2. Fatigue :  - Electrolytes and CBC are normal, Vitamin D elevated, could be the reason as well as low TSH , will adjust both    3. Hypervitaminosis  D :  - Will reduce Vitamin D3 to 1000 iu daily    Rechek labs in 8 weeks   F/U in 1 yr    Signed electronically by: Lyndle Herrlich, MD  [facility] End[facility]  [facility] [address] [place] [place], [place] [address] Phone: [phone] FAX: [phone]      CC: Tracie Found, MD [address] [place] [address] Phone: [phone]  Fax: [phone]   Return to Endocrinology clinic as below: Future Appointments  Date Time Provider Department Center  10/22/2020  3:40 PM Ashayla Subia, Konrad Dolores, MD [id number] None

## 2020-10-22 NOTE — Patient Instructions (Signed)
-   If you start Biotin, please Hold it 2-3 days before your thyroid blood test

## 2020-10-23 ENCOUNTER — Telehealth: Payer: Self-pay

## 2020-10-23 ENCOUNTER — Encounter: Payer: Self-pay | Admitting: Internal Medicine

## 2020-10-23 LAB — VITAMIN D 25 HYDROXY (VIT D DEFICIENCY, FRACTURES): VITD: 108.2 ng/mL (ref 30.00–100.00)

## 2020-10-23 NOTE — Telephone Encounter (Signed)
CRITICAL VALUE STICKER  CRITICAL VALUE: Vitamin D- 108.20  RECEIVER (on-site recipient of call): Wellington Hampshire, RMA  DATE & TIME NOTIFIED:  10/23/20: 9:12 AM  MESSENGER (representative from lab): [facility]  MD NOTIFIED: Yes  TIME OF NOTIFICATION: 10/23/20: 9:12 AM  RESPONSE: none yet

## 2020-12-15 ENCOUNTER — Other Ambulatory Visit: Payer: Self-pay

## 2020-12-15 ENCOUNTER — Encounter: Payer: Self-pay | Admitting: Emergency Medicine

## 2020-12-15 ENCOUNTER — Ambulatory Visit
Admission: EM | Admit: 2020-12-15 | Discharge: 2020-12-15 | Disposition: A | Discharge status: Home / Self Care | Payer: BC Managed Care – PPO | Attending: Internal Medicine | Admitting: Internal Medicine

## 2020-12-15 DIAGNOSIS — J309 Allergic rhinitis, unspecified: Secondary | ICD-10-CM

## 2020-12-15 MED ORDER — CETIRIZINE HCL 10 MG PO TABS: 10.0000 mg | ORAL_TABLET | Freq: Every day | ORAL | 0 refills | Status: AC

## 2020-12-15 MED ORDER — GUAIFENESIN ER 600 MG PO TB12: 600.0000 mg | ORAL_TABLET | Freq: Two times a day (BID) | ORAL | 0 refills | Status: AC

## 2020-12-15 MED ORDER — BENZONATATE 100 MG PO CAPS: 100.0000 mg | ORAL_CAPSULE | Freq: Three times a day (TID) | ORAL | 0 refills | Status: DC | PRN

## 2020-12-15 MED ORDER — FLUTICASONE PROPIONATE 50 MCG/ACT NA SUSP: 1.0000 | Freq: Every day | NASAL | 0 refills | Status: DC

## 2020-12-15 NOTE — Discharge Instructions (Addendum)
Take medications as prescribed If you have worsening symptoms please return to urgent care to be reevaluated I anticipate this will resolve over the next 5 to 7 days.

## 2020-12-15 NOTE — ED Triage Notes (Signed)
Ear pain, dental pain, nasal congestion, dry cough since yesterday.  States symptoms started after mowing the yard

## 2020-12-15 NOTE — ED Provider Notes (Signed)
[facility]    CSN: [id number] Arrival date & time: 12/15/20  0803      History   Chief Complaint No chief complaint on file.   HPI Tracie Ross is a 60 y.o. female comes to the urgent care with 1 day history of nasal congestion, ear fullness and a dry cough.  Patient was mowing her grass prior to onset of symptoms.  She complains of having some chills but no fever.  She has some slight itchy throat and nose.  She has history of seasonal allergies.  No shortness of breath or wheezing.  She is not vaccinated against COVID-19 virus.  No nausea, vomiting or diarrhea.  HPI  Past Medical History:  Diagnosis Date   GERD (gastroesophageal reflux disease)    Hypothyroidism     Patient Active Problem List   Diagnosis Date Noted   Acquired hypothyroidism 02/28/2019    Past Surgical History:  Procedure Laterality Date   CERVICAL FUSION  08/07/2016   Dr. Channing Mutters   [facility]  [facility]   [facility]   TONSILLECTOMY     age 44    OB History   No obstetric history on file.      Home Medications    Prior to Admission medications   Medication Sig Start Date End Date Taking? Authorizing Provider  benzonatate (TESSALON) 100 MG capsule Take 1 capsule (100 mg total) by mouth 3 (three) times daily as needed for cough. 12/15/20  Yes Welma Mccombs, Britta Mccreedy, MD  cetirizine (ZYRTEC ALLERGY) 10 MG tablet Take 1 tablet (10 mg total) by mouth daily. 12/15/20 01/14/21 Yes Kengo Sturges, Britta Mccreedy, MD  fluticasone (FLONASE) 50 MCG/ACT nasal spray Place 1 spray into both nostrils daily. 12/15/20  Yes Jaycee Pelzer, Britta Mccreedy, MD  guaiFENesin (MUCINEX) 600 MG 12 hr tablet Take 1 tablet (600 mg total) by mouth 2 (two) times daily for 10 days. 12/15/20 12/25/20 Yes Izzabell Klasen, Britta Mccreedy, MD  amitriptyline (ELAVIL) 50 MG tablet Take 50 mg by mouth at bedtime.    [provider]  Cholecalciferol (VITAMIN D) 50 MCG (2000 UT) tablet Take 2,000 Units by mouth daily.    [provider]  eletriptan (RELPAX) 20  MG tablet Take 20 mg by mouth as needed for migraine or headache. May repeat in 2 hours if headache persists or recurs.    [provider]  levothyroxine (SYNTHROID) 50 MCG tablet Taking 1 tablet on Monday to Saturday. Taking 2 tablets on Sunday 06/04/20   Shamleffer, Konrad Dolores, MD  linaclotide [facility]) 290 MCG CAPS capsule Take 290 mcg by mouth daily.    [provider]  metoprolol tartrate (LOPRESSOR) 25 MG tablet Take 0.5 tablets (12.5 mg total) by mouth 2 (two) times daily. 08/06/20   Antoine Poche, MD  topiramate (TOPAMAX) 100 MG tablet Take 100 mg by mouth daily.    [provider]    Family History Family History  Problem Relation Age of Onset   COPD Mother    Dementia Mother    Cirrhosis Mother    Asthma Mother    Hypothyroidism Mother    Stroke Mother    Breast cancer Mother 68   Hypertension Father    Heart attack Father    Stroke Father     Social History Social History   Tobacco Use   Smoking status: Never   Smokeless tobacco: Never  Substance Use Topics   Alcohol use: Never    Alcohol/week: 0.0 standard drinks   Drug use:  Never     Allergies   Tape and Iohexol   Review of Systems Review of Systems  Constitutional: Negative.   HENT:  Positive for congestion, ear pain, postnasal drip, rhinorrhea, sinus pressure, sinus pain and sore throat. Negative for voice change.   Respiratory:  Positive for cough. Negative for shortness of breath and wheezing.   Cardiovascular:  Negative for chest pain.  Neurological: Negative.     Physical Exam Triage Vital Signs ED Triage Vitals  Enc Vitals Group     BP 12/15/20 0811 122/87     Pulse Rate 12/15/20 0811 (!) 114     Resp 12/15/20 0811 16     Temp 12/15/20 0811 98.4 F (36.9 C)     Temp Source 12/15/20 0811 Oral     SpO2 12/15/20 0811 98 %     Weight --      Height --      Head Circumference --      Peak Flow --      Pain Score 12/15/20 0812 6     Pain Loc --      Pain  Edu? --      Excl. in GC? --    No data found.  Updated Vital Signs BP 122/87 (BP Location: Right Arm)   Pulse (!) 114   Temp 98.4 F (36.9 C) (Oral)   Resp 16   SpO2 98%   Visual Acuity Right Eye Distance:   Left Eye Distance:   Bilateral Distance:    Right Eye Near:   Left Eye Near:    Bilateral Near:     Physical Exam Vitals and nursing note reviewed.  Constitutional:      General: She is not in acute distress.    Appearance: She is not ill-appearing.  HENT:     Right Ear: Tympanic membrane normal.     Left Ear: Tympanic membrane normal.     Ears:     Comments: Bilateral middle ear effusion    Mouth/Throat:     Mouth: Mucous membranes are moist.     Pharynx: No posterior oropharyngeal erythema.  Cardiovascular:     Rate and Rhythm: Normal rate and regular rhythm.     Pulses: Normal pulses.     Heart sounds: Normal heart sounds.  Pulmonary:     Effort: Pulmonary effort is normal.     Breath sounds: Normal breath sounds.  Musculoskeletal:        General: Normal range of motion.  Neurological:     Mental Status: She is alert.     UC Treatments / Results  Labs (all labs ordered are listed, but only abnormal results are displayed) Labs Reviewed - No data to display  EKG   Radiology No results found.  Procedures Procedures (including critical care time)  Medications Ordered in UC Medications - No data to display  Initial Impression / Assessment and Plan / UC Course  I have reviewed the triage vital signs and the nursing notes.  Pertinent labs & imaging results that were available during my care of the patient were reviewed by me and considered in my medical decision making (see chart for details).     1.  Allergic rhinosinusitis: Flonase Zyrtec Saline nasal spray Mucinex twice daily Return to urgent care if symptoms worsen. Final Clinical Impressions(s) / UC Diagnoses   Final diagnoses:  Allergic rhinitis, unspecified seasonality,  unspecified trigger     Discharge Instructions      Take medications as prescribed  If you have worsening symptoms please return to urgent care to be reevaluated I anticipate this will resolve over the next 5 to 7 days.   ED Prescriptions     Medication Sig Dispense Auth. Provider   fluticasone (FLONASE) 50 MCG/ACT nasal spray Place 1 spray into both nostrils daily. 16 g Merrilee Jansky, MD   cetirizine (ZYRTEC ALLERGY) 10 MG tablet Take 1 tablet (10 mg total) by mouth daily. 30 tablet Daxtin Leiker, Britta Mccreedy, MD   benzonatate (TESSALON) 100 MG capsule Take 1 capsule (100 mg total) by mouth 3 (three) times daily as needed for cough. 21 capsule Dailen Mcclish, Britta Mccreedy, MD   guaiFENesin (MUCINEX) 600 MG 12 hr tablet Take 1 tablet (600 mg total) by mouth 2 (two) times daily for 10 days. 20 tablet Swade Shonka, Britta Mccreedy, MD      PDMP not reviewed this encounter.   Merrilee Jansky, MD 12/15/20 0830

## 2020-12-19 ENCOUNTER — Other Ambulatory Visit (INDEPENDENT_AMBULATORY_CARE_PROVIDER_SITE_OTHER): Payer: BC Managed Care – PPO

## 2020-12-19 ENCOUNTER — Other Ambulatory Visit: Payer: Self-pay

## 2020-12-19 DIAGNOSIS — E039 Hypothyroidism, unspecified: Secondary | ICD-10-CM

## 2020-12-19 DIAGNOSIS — E673 Hypervitaminosis D: Secondary | ICD-10-CM | POA: Diagnosis not present

## 2020-12-19 LAB — TSH: TSH: 0.97 u[IU]/mL (ref 0.35–5.50)

## 2020-12-20 LAB — VITAMIN D 25 HYDROXY (VIT D DEFICIENCY, FRACTURES): Vit D, 25-Hydroxy: 59.6 ng/mL (ref 30.0–100.0)

## 2021-01-16 ENCOUNTER — Other Ambulatory Visit: Payer: Self-pay | Admitting: Internal Medicine

## 2021-05-10 ENCOUNTER — Other Ambulatory Visit: Payer: Self-pay | Admitting: Internal Medicine

## 2021-07-03 ENCOUNTER — Other Ambulatory Visit: Payer: Self-pay | Admitting: Family Medicine

## 2021-07-03 DIAGNOSIS — Z1231 Encounter for screening mammogram for malignant neoplasm of breast: Secondary | ICD-10-CM

## 2021-09-06 ENCOUNTER — Ambulatory Visit
Admission: RE | Admit: 2021-09-06 | Discharge: 2021-09-06 | Disposition: A | Discharge status: Home / Self Care | Payer: BC Managed Care – PPO | Source: Ambulatory Visit | Attending: Family Medicine | Admitting: Family Medicine

## 2021-09-06 DIAGNOSIS — Z1231 Encounter for screening mammogram for malignant neoplasm of breast: Secondary | ICD-10-CM

## 2021-09-12 ENCOUNTER — Ambulatory Visit
Admission: EM | Admit: 2021-09-12 | Discharge: 2021-09-12 | Disposition: A | Discharge status: Home / Self Care | Payer: BC Managed Care – PPO | Attending: Nurse Practitioner | Admitting: Nurse Practitioner

## 2021-09-12 ENCOUNTER — Other Ambulatory Visit: Payer: Self-pay

## 2021-09-12 ENCOUNTER — Encounter: Payer: Self-pay | Admitting: Emergency Medicine

## 2021-09-12 DIAGNOSIS — J0140 Acute pansinusitis, unspecified: Secondary | ICD-10-CM

## 2021-09-12 MED ORDER — BENZONATATE 100 MG PO CAPS: 100.0000 mg | ORAL_CAPSULE | Freq: Three times a day (TID) | ORAL | 0 refills | Status: DC | PRN

## 2021-09-12 MED ORDER — AMOXICILLIN-POT CLAVULANATE 875-125 MG PO TABS: 1.0000 | ORAL_TABLET | Freq: Two times a day (BID) | ORAL | 0 refills | Status: DC

## 2021-09-12 NOTE — ED Triage Notes (Signed)
Pt reports cough, runny nose, congestion for last several days. Pt reports takes allergy medication and reports "this is worse than my allergies usually are."   Denies any known fevers, body aches, gi/gu symptoms.

## 2021-09-12 NOTE — Discharge Instructions (Signed)
Take medication as prescribed. Continue using your Allegra and Claritin and Flonase. Increase fluids and allow for plenty of rest. May use normal saline nasal spray to help with nasal congestion throughout the day. Recommend using a humidifier to help with cough and nasal congestion at bedtime. Follow-up if symptoms worsen or do not improve.

## 2021-09-12 NOTE — ED Provider Notes (Signed)
[facility]    CSN: [id number] Arrival date & time: 09/12/21  0800      History   Chief Complaint Chief Complaint  Patient presents with   Cough    HPI JENAI SCALETTA is a 61 y.o. female.   The patient is a 61 year old female who presents with upper respiratory symptoms.  Symptoms have been present for the past several weeks.  Patient reports a history of seasonal allergies.  She has been taking Claritin-D and Allegra.  She states that her symptoms have worsened over the past 24 hours to include worsening nasal congestion, cough, bilateral ear pressure, postnasal drainage, and cough.  She denies fever, but states that she has had intermittent chills.  She also has been using Flonase for her allergy symptoms.  She denies any sick contacts at this time.  The history is provided by the patient.   Past Medical History:  Diagnosis Date   GERD (gastroesophageal reflux disease)    Hypothyroidism     Patient Active Problem List   Diagnosis Date Noted   Acquired hypothyroidism 02/28/2019    Past Surgical History:  Procedure Laterality Date   CERVICAL FUSION  08/07/2016   Dr. Channing Mutters   [facility]  [facility]   [facility]   TONSILLECTOMY     age 32    OB History   No obstetric history on file.      Home Medications    Prior to Admission medications   Medication Sig Start Date End Date Taking? Authorizing Provider  amoxicillin-clavulanate (AUGMENTIN) 875-125 MG tablet Take 1 tablet by mouth every 12 (twelve) hours. 09/12/21  Yes Hisham Provence-Warren, Sadie Haber, NP  benzonatate (TESSALON PERLES) 100 MG capsule Take 1 capsule (100 mg total) by mouth 3 (three) times daily as needed for cough. 09/12/21  Yes Rebecca Motta-Warren, Sadie Haber, NP  amitriptyline (ELAVIL) 50 MG tablet Take 50 mg by mouth at bedtime.    [provider]  cetirizine (ZYRTEC ALLERGY) 10 MG tablet Take 1 tablet (10 mg total) by mouth daily. 12/15/20 01/14/21  Merrilee Jansky, MD  Cholecalciferol (VITAMIN D) 50  MCG (2000 UT) tablet Take 2,000 Units by mouth daily.    [provider]  eletriptan (RELPAX) 20 MG tablet Take 20 mg by mouth as needed for migraine or headache. May repeat in 2 hours if headache persists or recurs.    [provider]  fluticasone (FLONASE) 50 MCG/ACT nasal spray Place 1 spray into both nostrils daily. 12/15/20   Merrilee Jansky, MD  levothyroxine (SYNTHROID) 50 MCG tablet TAKING 1 TABLET ON MONDAY TO SATURDAY. TAKING 2 TABLETS ON SUNDAY 05/13/21   Shamleffer, Konrad Dolores, MD  linaclotide (LINZESS) 290 MCG CAPS capsule Take 290 mcg by mouth daily.    [provider]  metoprolol tartrate (LOPRESSOR) 25 MG tablet Take 0.5 tablets (12.5 mg total) by mouth 2 (two) times daily. 08/06/20   Antoine Poche, MD  topiramate (TOPAMAX) 100 MG tablet Take 100 mg by mouth daily.    [provider]    Family History Family History  Problem Relation Age of Onset   COPD Mother    Dementia Mother    Cirrhosis Mother    Asthma Mother    Hypothyroidism Mother    Stroke Mother    Breast cancer Mother 19   Hypertension Father    Heart attack Father    Stroke Father    Breast cancer Sister     Social History Social History  Tobacco Use   Smoking status: Never   Smokeless tobacco: Never  Substance Use Topics   Alcohol use: Never    Alcohol/week: 0.0 standard drinks   Drug use: Never     Allergies   Tape and Iohexol   Review of Systems Review of Systems  Constitutional:  Positive for chills.  HENT:  Positive for congestion, postnasal drip, sinus pressure and sore throat.   Eyes: Negative.   Respiratory:  Positive for cough. Negative for shortness of breath and wheezing.   Cardiovascular: Negative.   Gastrointestinal: Negative.   Skin: Negative.   Psychiatric/Behavioral: Negative.      Physical Exam Triage Vital Signs ED Triage Vitals  Enc Vitals Group     BP 09/12/21 0816 124/82     Pulse Rate 09/12/21 0816 96     Resp  09/12/21 0816 18     Temp 09/12/21 0816 98 F (36.7 C)     Temp Source 09/12/21 0816 Oral     SpO2 09/12/21 0816 97 %     Weight 09/12/21 0817 118 lb (53.5 kg)     Height 09/12/21 0817 5\' 1"  (1.549 m)     Head Circumference --      Peak Flow --      Pain Score 09/12/21 0817 10     Pain Loc --      Pain Edu? --      Excl. in GC? --    No data found.  Updated Vital Signs BP 124/82 (BP Location: Right Arm)   Pulse 96   Temp 98 F (36.7 C) (Oral)   Resp 18   Ht 5\' 1"  (1.549 m)   Wt 118 lb (53.5 kg)   SpO2 97%   BMI 22.30 kg/m   Visual Acuity Right Eye Distance:   Left Eye Distance:   Bilateral Distance:    Right Eye Near:   Left Eye Near:    Bilateral Near:     Physical Exam Vitals and nursing note reviewed.  Constitutional:      General: She is not in acute distress.    Appearance: Normal appearance.  HENT:     Head: Normocephalic.     Right Ear: Ear canal and external ear normal. A middle ear effusion is present.     Left Ear: Ear canal and external ear normal. A middle ear effusion is present.     Nose: Congestion present.     Right Turbinates: Enlarged and swollen.     Left Turbinates: Enlarged and swollen.     Right Sinus: Maxillary sinus tenderness and frontal sinus tenderness present.     Left Sinus: Maxillary sinus tenderness and frontal sinus tenderness present.     Mouth/Throat:     Pharynx: Posterior oropharyngeal erythema present. No oropharyngeal exudate.  Eyes:     Extraocular Movements: Extraocular movements intact.     Conjunctiva/sclera: Conjunctivae normal.     Pupils: Pupils are equal, round, and reactive to light.  Cardiovascular:     Rate and Rhythm: Normal rate and regular rhythm.     Pulses: Normal pulses.     Heart sounds: Normal heart sounds.  Pulmonary:     Effort: Pulmonary effort is normal. No respiratory distress.     Breath sounds: Normal breath sounds. No wheezing or rales.  Abdominal:     General: Bowel sounds are normal.      Palpations: Abdomen is soft.  Musculoskeletal:     Cervical back: Normal range of motion.  Skin:    General: Skin is warm and dry.     Capillary Refill: Capillary refill takes less than 2 seconds.  Neurological:     General: No focal deficit present.     Mental Status: She is alert and oriented to person, place, and time.  Psychiatric:        Mood and Affect: Mood normal.        Behavior: Behavior normal.     UC Treatments / Results  Labs (all labs ordered are listed, but only abnormal results are displayed) Labs Reviewed - No data to display  EKG   Radiology No results found.  Procedures Procedures (including critical care time)  Medications Ordered in UC Medications - No data to display  Initial Impression / Assessment and Plan / UC Course  I have reviewed the triage vital signs and the nursing notes.  Pertinent labs & imaging results that were available during my care of the patient were reviewed by me and considered in my medical decision making (see chart for details).  The patient is a 61 year old female who presents for upper respiratory symptoms.  Symptoms have been present for the past several weeks but have worsened over the past 24 hours.  Her vital signs are stable, she is in no acute distress.  Her exam is benign.  Symptoms are consistent with a bacterial sinusitis based on the duration of her symptoms and acute worsening.  There is no indication for COVID or influenza testing at this time.  We will start the patient on Augmentin, she is currently taking Claritin and Allegra, we will have her continue that medication.  Also she is taking Flonase we will also have her continue that.  Patient was also prescribed Tessalon Perles for her cough.  Patient was encouraged to perform supportive care.  Patient advised to follow-up with her primary care physician if symptoms do not improve. Final Clinical Impressions(s) / UC Diagnoses   Final diagnoses:  Acute  pansinusitis, recurrence not specified     Discharge Instructions      Take medication as prescribed. Continue using your Allegra and Claritin and Flonase. Increase fluids and allow for plenty of rest. May use normal saline nasal spray to help with nasal congestion throughout the day. Recommend using a humidifier to help with cough and nasal congestion at bedtime. Follow-up if symptoms worsen or do not improve.     ED Prescriptions     Medication Sig Dispense Auth. Provider   amoxicillin-clavulanate (AUGMENTIN) 875-125 MG tablet Take 1 tablet by mouth every 12 (twelve) hours. 14 tablet Alyxandria Wentz-Warren, Sadie Haber, NP   benzonatate (TESSALON PERLES) 100 MG capsule Take 1 capsule (100 mg total) by mouth 3 (three) times daily as needed for cough. 30 capsule Angello Chien-Warren, Sadie Haber, NP      PDMP not reviewed this encounter.   Abran Cantor, NP 09/12/21 [id number]

## 2021-10-23 ENCOUNTER — Ambulatory Visit (INDEPENDENT_AMBULATORY_CARE_PROVIDER_SITE_OTHER): Payer: BC Managed Care – PPO | Admitting: Internal Medicine

## 2021-10-23 ENCOUNTER — Encounter: Payer: Self-pay | Admitting: Internal Medicine

## 2021-10-23 VITALS — BP 120/72 | HR 89 | Ht 61.0 in | Wt 116.0 lb

## 2021-10-23 DIAGNOSIS — E039 Hypothyroidism, unspecified: Secondary | ICD-10-CM

## 2021-10-23 DIAGNOSIS — E673 Hypervitaminosis D: Secondary | ICD-10-CM

## 2021-10-23 LAB — TSH: TSH: 18.57 u[IU]/mL — ABNORMAL HIGH (ref 0.35–5.50)

## 2021-10-23 LAB — VITAMIN D 25 HYDROXY (VIT D DEFICIENCY, FRACTURES): VITD: 69.98 ng/mL (ref 30.00–100.00)

## 2021-10-23 LAB — T4, FREE: Free T4: 1.06 ng/dL (ref 0.60–1.60)

## 2021-10-23 NOTE — Progress Notes (Unsigned)
Name: Tracie Ross [facility]  MRN/ DOB: [id number], 1961/02/03    Age/ Sex: 61 y.o., female     PCP: Assunta Found, MD   Reason for Endocrinology Evaluation: Hypothyroidism      Initial Endocrinology Clinic Visit: 11/23/2018    PATIENT IDENTIFIER: Tracie Ross is a 61 y.o., female with a past medical history of Depression, chronic headaches. Tracie Ross has followed with [facility] since 11/23/2018 for consultative assistance with management of her hypothyroidism.   HISTORICAL SUMMARY: The patient was first diagnosed with hypothyroidism many years ago. Has been on Levothyroxine since her diagnosis. In 2016 Tracie Ross required Levothyroxine 125 mcg daily, but sometime in 2018 Tracie Ross started having extreme fatigue with hair loss and nail deformity and her dose has been gradually  reduced.    SUBJECTIVE:    Today (10/23/2021):  Tracie Ross is here for a follow up on hypothyroidism. Tracie Ross is compliant with LT-4 replacement. Tracie Ross takes appropriately    Tracie Ross has been noted with weight loss- intentional  Takes linzess for chronic constipation  Has noted occasional  local neck swelling especially around sinus infection   No biotin  Has noted LE cramps and pains   Levothyroxine 50 mcg , daily except Sunday 1.5 tabs  Vitamin D 1000 iu daily - has not taken vitamin D over the past year    HISTORY:  Past Medical History:  Past Medical History:  Diagnosis Date   GERD (gastroesophageal reflux disease)    Hypothyroidism    Past Surgical History:  Past Surgical History:  Procedure Laterality Date   CERVICAL FUSION  08/07/2016   Dr. Channing Mutters   [facility]GERY  1998   [facility]   TONSILLECTOMY     age 52   Social History:  reports that Tracie Ross has never smoked. Tracie Ross has never used smokeless tobacco. Tracie Ross reports that Tracie Ross does not drink alcohol and does not use drugs. Family History:  Family History  Problem Relation Age of Onset   COPD Mother    Dementia Mother    Cirrhosis Mother    Asthma Mother     Hypothyroidism Mother    Stroke Mother    Breast cancer Mother 24   Hypertension Father    Heart attack Father    Stroke Father    Breast cancer Sister      HOME MEDICATIONS: Allergies as of 10/23/2021       Reactions   Tape    Iohexol Itching, Rash   Desc: Patient began to itch after receiving omnipaque 300. Was given 25 mg benadryl and felt better., Onset Date: [id number]        Medication List        Accurate as of October 23, 2021  1:25 PM. If you have any questions, ask your nurse or doctor.          STOP taking these medications    amoxicillin-clavulanate 875-125 MG tablet Commonly known as: AUGMENTIN Stopped by: Scarlette Shorts, MD   benzonatate 100 MG capsule Commonly known as: Lawyer Stopped by: Scarlette Shorts, MD       TAKE these medications    amitriptyline 50 MG tablet Commonly known as: ELAVIL Take 50 mg by mouth at bedtime.   cetirizine 10 MG tablet Commonly known as: ZyrTEC Allergy Take 1 tablet (10 mg total) by mouth daily.   eletriptan 20 MG tablet Commonly known as: RELPAX Take 20 mg by mouth as needed for migraine or headache. May repeat in 2  hours if headache persists or recurs.   fluticasone 50 MCG/ACT nasal spray Commonly known as: FLONASE Place 1 spray into both nostrils daily.   levothyroxine 50 MCG tablet Commonly known as: SYNTHROID TAKING 1 TABLET ON MONDAY TO SATURDAY. TAKING 2 TABLETS ON SUNDAY What changed: See the new instructions.   linaclotide 290 MCG Caps capsule Commonly known as: LINZESS Take 290 mcg by mouth daily.   metoprolol tartrate 25 MG tablet Commonly known as: LOPRESSOR Take 0.5 tablets (12.5 mg total) by mouth 2 (two) times daily.   topiramate 100 MG tablet Commonly known as: TOPAMAX Take 100 mg by mouth daily.   Vitamin D 50 MCG (2000 UT) tablet Take 2,000 Units by mouth daily.          OBJECTIVE:   PHYSICAL EXAM: VS: BP 120/72 (BP Location: Left Arm, Patient  Position: Sitting, Cuff Size: Small)   Pulse 89   Ht 5\' 1"  (1.549 m)   Wt 116 lb (52.6 kg)   SpO2 96%   BMI 21.92 kg/m    EXAM: General: Pt appears well and is in NAD  Neck: General: Supple without adenopathy. Thyroid: No goiter or nodules appreciated. No thyroid bruit.  Lungs: Clear with good BS bilat with no rales, rhonchi, or wheezes  Heart: Auscultation: RRR.  Abdomen: Normoactive bowel sounds, soft, nontender, without masses or organomegaly palpable  Extremities:  BL LE: No pretibial edema normal ROM and strength.  Mental Status: Judgment, insight: Intact Orientation: Oriented to time, place, and person Mood and affect: No depression, anxiety, or agitation     DATA REVIEWED:   Latest Reference Range & Units 10/23/21 13:32  VITD 30.00 - 100.00 ng/mL 69.98  TSH 0.35 - 5.50 uIU/mL 18.57 (H)  T4,Free(Direct) 0.60 - 1.60 ng/dL 10/25/21       ASSESSMENT / PLAN / RECOMMENDATIONS:   Hypothyroidism    - Tracie Ross has been compliant with LT-4 replacement.  -This has been challenging due to variable TSH levels despite at times with no levothyroxine changes or minimal changes. -Her TSH today is elevated at 18.57 u IU/mL with normal free T4.  Tracie Ross is not on any biotin -I will consider adding T3 and see if this will improve her TFTs -Tracie Ross will have labs repeated on August 21 at 10 AM, but will change to [facility] in case there is antibody interference   Medications   Levothyroxine 50 mcg , daily except sundays takes 1.5 tabs  Start liothyronine 5 mg daily    2. Hypervitaminosis  D :  - We had reduced Vitamin D3 to 1000 iu daily but Tracie Ross stopped it almost a year ago  - Tracie Ross had developed leg pains and cramps and wonders if this is related to her lack of vitamin D intake  -Vitamin D is normal   Rechek labs in 6 weeks   F/U in 1 yr  Addendum: Discussed labs with the patient on 10/24/2021 at 1530  Signed electronically by: 10/26/2021, MD  [facility]   [facility] [address] [place] [place], [place] Phone: [phone] FAX: [phone]      CC: [phone], MD [address] [place] Phone: [phone]  Fax: [phone]   Return to Endocrinology clinic as below: Future Appointments  Date Time Provider Department Center  11/07/2021  1:40 PM Branch, 11/09/2021, MD [facility]

## 2021-10-24 ENCOUNTER — Telehealth: Payer: Self-pay

## 2021-10-24 MED ORDER — LIOTHYRONINE SODIUM 5 MCG PO TABS: 5.0000 ug | ORAL_TABLET | Freq: Every day | ORAL | 3 refills | Status: DC

## 2021-10-24 NOTE — Telephone Encounter (Signed)
Patient saw lab result on portal and would like to know which medication adjustment she needs to make.

## 2021-11-07 ENCOUNTER — Ambulatory Visit (INDEPENDENT_AMBULATORY_CARE_PROVIDER_SITE_OTHER): Payer: BC Managed Care – PPO | Admitting: Cardiology

## 2021-11-07 ENCOUNTER — Encounter: Payer: Self-pay | Admitting: Cardiology

## 2021-11-07 VITALS — BP 120/82 | HR 82 | Ht 60.0 in | Wt 116.0 lb

## 2021-11-07 DIAGNOSIS — R0609 Other forms of dyspnea: Secondary | ICD-10-CM | POA: Diagnosis not present

## 2021-11-07 DIAGNOSIS — R002 Palpitations: Secondary | ICD-10-CM

## 2021-11-07 DIAGNOSIS — R0789 Other chest pain: Secondary | ICD-10-CM | POA: Diagnosis not present

## 2021-11-07 MED ORDER — METOPROLOL SUCCINATE ER 25 MG PO TB24: 25.0000 mg | ORAL_TABLET | Freq: Every day | ORAL | 6 refills | Status: DC

## 2021-11-07 NOTE — Progress Notes (Signed)
Clinical Summary Tracie Ross is a 61 y.o.female seen today for follow up of the following medical problems.   1. Chest pain - no recent symptoms      2. Tachycardia/palpitatins Event monitoring in October 2018 demonstrated paroxysms of sinus tachycardia with rates as high as 148 bpm.   - infrequent palpitations, though notices some high heart rates at times.  - has been taking her lopressor just prn as opposed to daily.   - some symptoms at times, no set pattern - takes lopressor just in AM, forgets often to take.     3. Hypothyroidism - followed by endocrine.  - last TSH was 18.   4. DOE -  DOE with just walking around the house - some dry cough, no wheezing. Does report allergies - no recent edema. No specific chest pains. -reports physically not as active as she used to be, used to walk 2 miles regularly.      Past Medical History:  Diagnosis Date   GERD (gastroesophageal reflux disease)    Hypothyroidism      Allergies  Allergen Reactions   Tape    Iohexol Itching and Rash    Desc: Patient began to itch after receiving omnipaque 300. Was given 25 mg benadryl and felt better., Onset Date: [id number]      Current Outpatient Medications  Medication Sig Dispense Refill   amitriptyline (ELAVIL) 50 MG tablet Take 50 mg by mouth at bedtime.     cetirizine (ZYRTEC ALLERGY) 10 MG tablet Take 1 tablet (10 mg total) by mouth daily. 30 tablet 0   Cholecalciferol (VITAMIN D) 50 MCG (2000 UT) tablet Take 2,000 Units by mouth daily.     eletriptan (RELPAX) 20 MG tablet Take 20 mg by mouth as needed for migraine or headache. May repeat in 2 hours if headache persists or recurs.     fluticasone (FLONASE) 50 MCG/ACT nasal spray Place 1 spray into both nostrils daily. 16 g 0   levothyroxine (SYNTHROID) 50 MCG tablet TAKING 1 TABLET ON MONDAY TO SATURDAY. TAKING 2 TABLETS ON SUNDAY (Patient taking differently: Taking 1 tablet on Monday to Saturday. Taking 1.5 tablets on  Sunday) 96 tablet 1   linaclotide (LINZESS) 290 MCG CAPS capsule Take 290 mcg by mouth daily.     liothyronine (CYTOMEL) 5 MCG tablet Take 1 tablet (5 mcg total) by mouth daily. 90 tablet 3   metoprolol tartrate (LOPRESSOR) 25 MG tablet Take 0.5 tablets (12.5 mg total) by mouth 2 (two) times daily. 90 tablet 1   topiramate (TOPAMAX) 100 MG tablet Take 100 mg by mouth daily.     No current facility-administered medications for this visit.     Past Surgical History:  Procedure Laterality Date   CERVICAL FUSION  08/07/2016   Dr. Channing Mutters   [facility]  [facility]   [facility]   TONSILLECTOMY     age 85     Allergies  Allergen Reactions   Tape    Iohexol Itching and Rash    Desc: Patient began to itch after receiving omnipaque 300. Was given 25 mg benadryl and felt better., Onset Date: [id number]       Family History  Problem Relation Age of Onset   COPD Mother    Dementia Mother    Cirrhosis Mother    Asthma Mother    Hypothyroidism Mother    Stroke Mother    Breast cancer Mother 20   Hypertension Father    Heart attack Father  Stroke Father    Breast cancer Sister      Social History Ms. Kenney reports that she has never smoked. She has never used smokeless tobacco. Ms. Guerin reports no history of alcohol use.   Review of Systems CONSTITUTIONAL: No weight loss, fever, chills, weakness or fatigue.  HEENT: Eyes: No visual loss, blurred vision, double vision or yellow sclerae.No hearing loss, sneezing, congestion, runny nose or sore throat.  SKIN: No rash or itching.  CARDIOVASCULAR: per hpi RESPIRATORY: per hpi GASTROINTESTINAL: No anorexia, nausea, vomiting or diarrhea. No abdominal pain or blood.  GENITOURINARY: No burning on urination, no polyuria NEUROLOGICAL: No headache, dizziness, syncope, paralysis, ataxia, numbness or tingling in the extremities. No change in bowel or bladder control.  MUSCULOSKELETAL: No muscle, back pain, joint pain or stiffness.  LYMPHATICS:  No enlarged nodes. No history of splenectomy.  PSYCHIATRIC: No history of depression or anxiety.  ENDOCRINOLOGIC: No reports of sweating, cold or heat intolerance. No polyuria or polydipsia.  Marland Kitchen   Physical Examination Today's Vitals   11/07/21 1333  BP: 120/82  Pulse: 82  SpO2: 92%  Weight: 116 lb (52.6 kg)  Height: 5' (1.524 m)   Body mass index is 22.65 kg/m.  Gen: resting comfortably, no acute distress HEENT: no scleral icterus, pupils equal round and reactive, no palptable cervical adenopathy,  CV: RRR, no m/r/ gno jvd Resp: Clear to auscultation bilaterally GI: abdomen is soft, non-tender, non-distended, normal bowel sounds, no hepatosplenomegaly MSK: extremities are warm, no edema.  Skin: warm, no rash Neuro:  no focal deficits Psych: appropriate affect   Diagnostic Studies  12/2016 echo Study Conclusions   - Left ventricle: The cavity size was normal. Wall thickness was    normal. Systolic function was normal. The estimated ejection    fraction was in the range of 55% to 60%. Wall motion was normal;    there were no regional wall motion abnormalities. Left    ventricular diastolic function parameters were normal.  - Aortic valve: Mildly calcified annulus. Trileaflet.  - Mitral valve: There was trivial regurgitation.  - Right atrium: Central venous pressure (est): 3 mm Hg.  - Atrial septum: No defect or patent foramen ovale was identified.  - Tricuspid valve: There was trivial regurgitation.  - Pericardium, extracardiac: There was no pericardial effusion.   Impressions:   - Normal LV wall thickness with LVEF 55-60% and normal diastolic    function. Trivial mitral regurgitation. Mildly calcified aortic    annulus. Trivial tricuspid regurgitation.    01/2017 heart monitor Patient unable to wear monitor for entire period of time. Paroxysms of sinus tachycardia seen with rates as high as 148 bpm.       Assessment and Plan   1. Palpitations - some ongoing  symptoms, difficultly taking lopressor bid. Will change to toprol 25mg  daily and monitor symptoms. - EKG today show SR, PAC   2. DOE - discussed possible echo, prefers to monitor symptoms at this time which is reasonable. DOes not appear volume overloaded, no recent chest pains. Low thryoid could be causing some fatigue, more sedentary perhaps some deconditioning playing a role - monitor at this time   F/u 6 months.      , M.D.

## 2021-11-07 NOTE — Patient Instructions (Signed)
Medication Instructions:  Stop Lopressor (Metoprolol Tart) Begin Toprol XL 25mg daily  Continue all other medications.     Labwork: none  Testing/Procedures: none  Follow-Up: 6 months   Any Other Special Instructions Will Be Listed Below (If Applicable).   If you need a refill on your cardiac medications before your next appointment, please call your pharmacy.  

## 2021-11-19 ENCOUNTER — Other Ambulatory Visit: Payer: Self-pay | Admitting: Internal Medicine

## 2021-12-16 ENCOUNTER — Other Ambulatory Visit (INDEPENDENT_AMBULATORY_CARE_PROVIDER_SITE_OTHER): Payer: BC Managed Care – PPO

## 2021-12-16 DIAGNOSIS — E039 Hypothyroidism, unspecified: Secondary | ICD-10-CM | POA: Diagnosis not present

## 2021-12-17 ENCOUNTER — Telehealth: Payer: Self-pay

## 2021-12-17 LAB — TSH: TSH: 4.05 u[IU]/mL (ref 0.450–4.500)

## 2021-12-17 LAB — T4, FREE: Free T4: 1.19 ng/dL (ref 0.82–1.77)

## 2021-12-17 NOTE — Telephone Encounter (Signed)
Patient notified and verbalized understanding. 

## 2021-12-17 NOTE — Telephone Encounter (Signed)
Patient was thinking that a T3 was suppose to be done on yesterday labs to determine if the Liothyronine was working and if it needed to be adjusted.

## 2022-01-23 ENCOUNTER — Ambulatory Visit
Admission: EM | Admit: 2022-01-23 | Discharge: 2022-01-23 | Disposition: A | Discharge status: Home / Self Care | Payer: BC Managed Care – PPO | Attending: Family Medicine | Admitting: Family Medicine

## 2022-01-23 ENCOUNTER — Encounter: Payer: Self-pay | Admitting: Emergency Medicine

## 2022-01-23 ENCOUNTER — Other Ambulatory Visit: Payer: Self-pay

## 2022-01-23 DIAGNOSIS — J3489 Other specified disorders of nose and nasal sinuses: Secondary | ICD-10-CM | POA: Diagnosis not present

## 2022-01-23 DIAGNOSIS — J040 Acute laryngitis: Secondary | ICD-10-CM

## 2022-01-23 DIAGNOSIS — H9202 Otalgia, left ear: Secondary | ICD-10-CM | POA: Diagnosis not present

## 2022-01-23 MED ORDER — BENZONATATE 200 MG PO CAPS
200.0000 mg | ORAL_CAPSULE | Freq: Three times a day (TID) | ORAL | 0 refills | Status: DC | PRN
Start: 2022-01-23 — End: 2022-05-26

## 2022-01-23 MED ORDER — FLUTICASONE PROPIONATE 50 MCG/ACT NA SUSP: 1.0000 | Freq: Two times a day (BID) | NASAL | 2 refills | Status: DC

## 2022-01-23 NOTE — Discharge Instructions (Addendum)
I suspect your symptoms to be related to an early viral illness.  You may continue to have developing symptoms, such as nasal congestion, cough.  I have sent over Tessalon Perles, Flonase to help with some of your symptoms and I recommend salt water gargles, warm honey tea, throat lozenges and throat sprays, decongestants to help with your symptoms.  We have not done COVID and flu testing today but I do recommend this if you start having any fevers, chills, body aches.

## 2022-01-23 NOTE — ED Provider Notes (Signed)
[facility]    CSN: [id number] Arrival date & time: 01/23/22  0816      History   Chief Complaint Chief Complaint  Patient presents with   Hoarse    HPI RAYLEN PRUETER is a 61 y.o. female.   Patient presenting today with 1 day history of sore throat, hoarse voice, postnasal drip, sinus pressure, left ear pain.  Denies nasal congestion, cough, chest pain, shortness of breath, fever, chills, body aches.  So far not trying anything over-the-counter for symptoms.  No known sick contacts recently.  History of seasonal allergies on Zyrtec daily.    Past Medical History:  Diagnosis Date   GERD (gastroesophageal reflux disease)    Hypothyroidism     Patient Active Problem List   Diagnosis Date Noted   Acquired hypothyroidism 02/28/2019    Past Surgical History:  Procedure Laterality Date   CERVICAL FUSION  08/07/2016   Dr. Channing Mutters   [facility]  [facility]   TONSILLECTOMY     age 18    OB History   No obstetric history on file.      Home Medications    Prior to Admission medications   Medication Sig Start Date End Date Taking? Authorizing Provider  benzonatate (TESSALON) 200 MG capsule Take 1 capsule (200 mg total) by mouth 3 (three) times daily as needed for cough. 01/23/22  Yes Particia Nearing, PA-C  fluticasone [facility]) 50 MCG/ACT nasal spray Place 1 spray into both nostrils 2 (two) times daily. 01/23/22  Yes Particia Nearing, PA-C  amitriptyline (ELAVIL) 50 MG tablet Take 50 mg by mouth at bedtime.    [provider]  cetirizine (ZYRTEC ALLERGY) 10 MG tablet Take 1 tablet (10 mg total) by mouth daily. 12/15/20 11/08/22  Merrilee Jansky, MD  Cholecalciferol (VITAMIN D) 50 MCG (2000 UT) tablet Take 2,000 Units by mouth daily.    [provider]  eletriptan (RELPAX) 20 MG tablet Take 20 mg by mouth as needed for migraine or headache. May repeat in 2 hours if headache persists or recurs.    [provider]   fluticasone (FLONASE) 50 MCG/ACT nasal spray Place 1 spray into both nostrils daily. 12/15/20   Merrilee Jansky, MD  levothyroxine (SYNTHROID) 50 MCG tablet Taking 1 tablet on Monday to Saturday. Taking 1.5 tablets on Sunday 11/19/21   Shamleffer, Konrad Dolores, MD  linaclotide (LINZESS) 290 MCG CAPS capsule Take 290 mcg by mouth daily.    [provider]  liothyronine (CYTOMEL) 5 MCG tablet Take 1 tablet (5 mcg total) by mouth daily. 10/24/21   Shamleffer, Konrad Dolores, MD  metoprolol succinate (TOPROL XL) 25 MG 24 hr tablet Take 1 tablet (25 mg total) by mouth daily. 11/07/21   Antoine Poche, MD  topiramate (TOPAMAX) 100 MG tablet Take 100 mg by mouth daily.    [provider]    Family History Family History  Problem Relation Age of Onset   COPD Mother    Dementia Mother    Cirrhosis Mother    Asthma Mother    Hypothyroidism Mother    Stroke Mother    Breast cancer Mother 74   Hypertension Father    Heart attack Father    Stroke Father    Breast cancer Sister     Social History Social History   Tobacco Use   Smoking status: Never   Smokeless tobacco: Never  Substance Use Topics   Alcohol use: Never  Alcohol/week: 0.0 standard drinks of alcohol   Drug use: Never     Allergies   Tape and Iohexol   Review of Systems Review of Systems Per HPI  Physical Exam Triage Vital Signs ED Triage Vitals  Enc Vitals Group     BP 01/23/22 0849 118/83     Pulse Rate 01/23/22 0849 (!) 103     Resp 01/23/22 0849 20     Temp 01/23/22 0849 98.2 F (36.8 C)     Temp Source 01/23/22 0849 Oral     SpO2 01/23/22 0849 96 %     Weight --      Height --      Head Circumference --      Peak Flow --      Pain Score 01/23/22 0850 0     Pain Loc --      Pain Edu? --      Excl. in [place]? --    No data found.  Updated Vital Signs BP 118/83 (BP Location: Right Arm)   Pulse (!) 103   Temp 98.2 F (36.8 C) (Oral)   Resp 20   SpO2 96%   Visual  Acuity Right Eye Distance:   Left Eye Distance:   Bilateral Distance:    Right Eye Near:   Left Eye Near:    Bilateral Near:     Physical Exam Vitals and nursing note reviewed.  Constitutional:      Appearance: Normal appearance.  HENT:     Head: Atraumatic.     Right Ear: Tympanic membrane and external ear normal.     Left Ear: Tympanic membrane and external ear normal.     Nose: Rhinorrhea present.     Mouth/Throat:     Mouth: Mucous membranes are moist.     Pharynx: Posterior oropharyngeal erythema present. No oropharyngeal exudate.     Comments: No tonsillar edema, exudates.  Uvula midline, nares patent.  Postnasal drip pattern. Eyes:     Extraocular Movements: Extraocular movements intact.     Conjunctiva/sclera: Conjunctivae normal.  Cardiovascular:     Rate and Rhythm: Normal rate and regular rhythm.     Heart sounds: Normal heart sounds.  Pulmonary:     Effort: Pulmonary effort is normal.     Breath sounds: Normal breath sounds. No wheezing or rales.  Musculoskeletal:        General: Normal range of motion.     Cervical back: Normal range of motion and neck supple.  Lymphadenopathy:     Cervical: No cervical adenopathy.  Skin:    General: Skin is warm and dry.  Neurological:     Mental Status: She is alert and oriented to person, place, and time.  Psychiatric:        Mood and Affect: Mood normal.        Thought Content: Thought content normal.      UC Treatments / Results  Labs (all labs ordered are listed, but only abnormal results are displayed) Labs Reviewed - No data to display  EKG   Radiology No results found.  Procedures Procedures (including critical care time)  Medications Ordered in UC Medications - No data to display  Initial Impression / Assessment and Plan / UC Course  I have reviewed the triage vital signs and the nursing notes.  Pertinent labs & imaging results that were available during my care of the patient were reviewed by  me and considered in my medical decision making (see chart for details).  Suspect new viral illness, treat with Flonase, salt water gargles, warm liquids, decongestants, over-the-counter pain levers as needed.  She declines viral testing today.  Return for any worsening symptoms.  Final Clinical Impressions(s) / UC Diagnoses   Final diagnoses:  Laryngitis, acute  Sinus drainage  Left ear pain     Discharge Instructions      I suspect your symptoms to be related to an early viral illness.  You may continue to have developing symptoms, such as nasal congestion, cough.  I have sent over Tessalon Perles, Flonase to help with some of your symptoms and I recommend salt water gargles, warm honey tea, throat lozenges and throat sprays, decongestants to help with your symptoms.  We have not done COVID and flu testing today but I do recommend this if you start having any fevers, chills, body aches.    ED Prescriptions     Medication Sig Dispense Auth. Provider   fluticasone (FLONASE) 50 MCG/ACT nasal spray Place 1 spray into both nostrils 2 (two) times daily. 16 g Volney American, PA-C   benzonatate (TESSALON) 200 MG capsule Take 1 capsule (200 mg total) by mouth 3 (three) times daily as needed for cough. 20 capsule Volney American, [place]      PDMP not reviewed this encounter.   Merrie Roof [place], [place] 01/23/22 [phone]

## 2022-01-23 NOTE — ED Triage Notes (Signed)
Pt reports has lost voice and reports scratchy throat since yesterday. Denies any known fevers, gi/gu symptoms.

## 2022-04-22 ENCOUNTER — Other Ambulatory Visit: Payer: Self-pay | Admitting: Family Medicine

## 2022-05-26 ENCOUNTER — Encounter: Payer: Self-pay | Admitting: Cardiology

## 2022-05-26 ENCOUNTER — Ambulatory Visit: Payer: BC Managed Care – PPO | Attending: Cardiology | Admitting: Cardiology

## 2022-05-26 VITALS — BP 119/80 | HR 86 | Ht 60.0 in | Wt 117.0 lb

## 2022-05-26 DIAGNOSIS — R002 Palpitations: Secondary | ICD-10-CM

## 2022-05-26 DIAGNOSIS — R0602 Shortness of breath: Secondary | ICD-10-CM

## 2022-05-26 DIAGNOSIS — R0609 Other forms of dyspnea: Secondary | ICD-10-CM

## 2022-05-26 NOTE — Progress Notes (Signed)
Clinical Summary Tracie Ross is a 62 y.o.female seen today for follow up of the following medical problems.     1. Chest pain - seen previously for, has not been a recent issue     2. Tachycardia/palpitatins Event monitoring in October 2018 demonstrated paroxysms of sinus tachycardia with rates as high as 148 bpm.      - some symptoms at times, no set pattern - takes lopressor just in AM, forgets often to take.   - some high heart rates at times, mild symptoms      3. Hypothyroidism - followed by endocrine.  - last TSH was 18.   - some ongoing fatigue   4. [place] with just walking around the house - some dry cough, no wheezing. Does report allergies - no recent edema. No specific chest pains. -reports physically not as active as she used to be, used to walk 2 miles regularly.      - some DOE with acitivities - no recent edema.  - some cough, nonproductive.  - no chest pains.    Past Medical History:  Diagnosis Date   GERD (gastroesophageal reflux disease)    Hypothyroidism      Allergies  Allergen Reactions   Tape    Iohexol Itching and Rash    Desc: Patient began to itch after receiving omnipaque 300. Was given 25 mg benadryl and felt better., Onset Date: [id number]      Current Outpatient Medications  Medication Sig Dispense Refill   amitriptyline (ELAVIL) 50 MG tablet Take 50 mg by mouth at bedtime.     benzonatate (TESSALON) 200 MG capsule Take 1 capsule (200 mg total) by mouth 3 (three) times daily as needed for cough. 20 capsule 0   cetirizine (ZYRTEC ALLERGY) 10 MG tablet Take 1 tablet (10 mg total) by mouth daily. 30 tablet 0   Cholecalciferol (VITAMIN D) 50 MCG (2000 UT) tablet Take 2,000 Units by mouth daily.     eletriptan (RELPAX) 20 MG tablet Take 20 mg by mouth as needed for migraine or headache. May repeat in 2 hours if headache persists or recurs.     fluticasone (FLONASE) 50 MCG/ACT nasal spray Place 1 spray into both nostrils  daily. 16 g 0   fluticasone (FLONASE) 50 MCG/ACT nasal spray Place 1 spray into both nostrils 2 (two) times daily. 16 g 2   levothyroxine (SYNTHROID) 50 MCG tablet Taking 1 tablet on Monday to Saturday. Taking 1.5 tablets on Sunday 96 tablet 1   linaclotide (LINZESS) 290 MCG CAPS capsule Take 290 mcg by mouth daily.     liothyronine (CYTOMEL) 5 MCG tablet Take 1 tablet (5 mcg total) by mouth daily. 90 tablet 3   metoprolol succinate (TOPROL XL) 25 MG 24 hr tablet Take 1 tablet (25 mg total) by mouth daily. 30 tablet 6   topiramate (TOPAMAX) 100 MG tablet Take 100 mg by mouth daily.     No current facility-administered medications for this visit.     Past Surgical History:  Procedure Laterality Date   CERVICAL FUSION  08/07/2016   Dr. Carloyn Manner   [facility]GERY  1998   [facility]   TONSILLECTOMY     age 89     Allergies  Allergen Reactions   Tape    Iohexol Itching and Rash    Desc: Patient began to itch after receiving omnipaque 300. Was given 25 mg benadryl and felt better., Onset Date: [id number]  Family History  Problem Relation Age of Onset   COPD Mother    Dementia Mother    Cirrhosis Mother    Asthma Mother    Hypothyroidism Mother    Stroke Mother    Breast cancer Mother 68   Hypertension Father    Heart attack Father    Stroke Father    Breast cancer Sister      Social History Tracie Ross reports that she has never smoked. She has never used smokeless tobacco. Tracie Ross reports no history of alcohol use.   Review of Systems CONSTITUTIONAL: No weight loss, fever, chills, weakness or fatigue.  HEENT: Eyes: No visual loss, blurred vision, double vision or yellow sclerae.No hearing loss, sneezing, congestion, runny nose or sore throat.  SKIN: No rash or itching.  CARDIOVASCULAR: per hpi RESPIRATORY: No shortness of breath, cough or sputum.  GASTROINTESTINAL: No anorexia, nausea, vomiting or diarrhea. No abdominal pain or blood.  GENITOURINARY: No burning on  urination, no polyuria NEUROLOGICAL: No headache, dizziness, syncope, paralysis, ataxia, numbness or tingling in the extremities. No change in bowel or bladder control.  MUSCULOSKELETAL: No muscle, back pain, joint pain or stiffness.  LYMPHATICS: No enlarged nodes. No history of splenectomy.  PSYCHIATRIC: No history of depression or anxiety.  ENDOCRINOLOGIC: No reports of sweating, cold or heat intolerance. No polyuria or polydipsia.  Marland Kitchen   Physical Examination Today's Vitals   05/26/22 1330  BP: 119/80  Pulse: 86  SpO2: 97%  Weight: 117 lb (53.1 kg)  Height: 5' (1.524 m)   Body mass index is 22.85 kg/m.  Gen: resting comfortably, no acute distress HEENT: no scleral icterus, pupils equal round and reactive, no palptable cervical adenopathy,  CV: RRR, no m/r/g, no jvd Resp: Clear to auscultation bilaterally GI: abdomen is soft, non-tender, non-distended, normal bowel sounds, no hepatosplenomegaly MSK: extremities are warm, no edema.  Skin: warm, no rash Neuro:  no focal deficits Psych: appropriate affect   Diagnostic Studies  12/2016 echo Study Conclusions   - Left ventricle: The cavity size was normal. Wall thickness was    normal. Systolic function was normal. The estimated ejection    fraction was in the range of 55% to 60%. Wall motion was normal;    there were no regional wall motion abnormalities. Left    ventricular diastolic function parameters were normal.  - Aortic valve: Mildly calcified annulus. Trileaflet.  - Mitral valve: There was trivial regurgitation.  - Right atrium: Central venous pressure (est): 3 mm Hg.  - Atrial septum: No defect or patent foramen ovale was identified.  - Tricuspid valve: There was trivial regurgitation.  - Pericardium, extracardiac: There was no pericardial effusion.   Impressions:   - Normal LV wall thickness with LVEF 55-60% and normal diastolic    function. Trivial mitral regurgitation. Mildly calcified aortic    annulus.  Trivial tricuspid regurgitation.    01/2017 heart monitor Patient unable to wear monitor for entire period of time. Paroxysms of sinus tachycardia seen with rates as high as 148 bpm.   Assessment and Plan  1. Palpitations - mild infrequent symptoms, she reports not bad enough where she would like to increase toprol dose - continue current meds   2. DOE - ongoing symptoms, obtain echo.  - dont have suspicions for ischemia at this time, would hold on stress testing.  - if benign echo would just continue to monitor  F/u 6 months      Arnoldo Lenis, M.D.

## 2022-05-26 NOTE — Patient Instructions (Signed)
Medication Instructions:  Continue all current medications.  Labwork: none  Testing/Procedures: Your physician has requested that you have an echocardiogram. Echocardiography is a painless test that uses sound waves to create images of your heart. It provides your doctor with information about the size and shape of your heart and how well your heart's chambers and valves are working. This procedure takes approximately one hour. There are no restrictions for this procedure. Please do NOT wear cologne, perfume, aftershave, or lotions (deodorant is allowed). Please arrive 15 minutes prior to your appointment time. Office will contact with results via phone, letter or mychart.     Follow-Up: 6 months   Any Other Special Instructions Will Be Listed Below (If Applicable).   If you need a refill on your cardiac medications before your next appointment, please call your pharmacy.  

## 2022-06-10 ENCOUNTER — Other Ambulatory Visit: Payer: Self-pay | Admitting: Internal Medicine

## 2022-06-12 ENCOUNTER — Other Ambulatory Visit: Payer: BC Managed Care – PPO

## 2022-08-25 ENCOUNTER — Other Ambulatory Visit: Payer: Self-pay | Admitting: Internal Medicine

## 2022-08-28 ENCOUNTER — Other Ambulatory Visit: Payer: Self-pay | Admitting: Family Medicine

## 2022-08-28 DIAGNOSIS — Z0000 Encounter for general adult medical examination without abnormal findings: Secondary | ICD-10-CM

## 2022-09-15 ENCOUNTER — Other Ambulatory Visit: Payer: Self-pay | Admitting: Internal Medicine

## 2022-09-30 ENCOUNTER — Ambulatory Visit: Payer: BC Managed Care – PPO

## 2022-10-22 ENCOUNTER — Ambulatory Visit (INDEPENDENT_AMBULATORY_CARE_PROVIDER_SITE_OTHER): Payer: BC Managed Care – PPO | Admitting: Internal Medicine

## 2022-10-22 ENCOUNTER — Encounter: Payer: Self-pay | Admitting: Internal Medicine

## 2022-10-22 VITALS — BP 112/76 | HR 95 | Ht 60.0 in | Wt 121.0 lb

## 2022-10-22 DIAGNOSIS — E673 Hypervitaminosis D: Secondary | ICD-10-CM

## 2022-10-22 DIAGNOSIS — E039 Hypothyroidism, unspecified: Secondary | ICD-10-CM

## 2022-10-22 DIAGNOSIS — R5383 Other fatigue: Secondary | ICD-10-CM | POA: Diagnosis not present

## 2022-10-22 LAB — TSH: TSH: 3.38 u[IU]/mL (ref 0.35–5.50)

## 2022-10-22 LAB — VITAMIN B12: Vitamin B-12: 357 pg/mL (ref 211–911)

## 2022-10-22 LAB — VITAMIN D 25 HYDROXY (VIT D DEFICIENCY, FRACTURES): VITD: 39.74 ng/mL (ref 30.00–100.00)

## 2022-10-22 NOTE — Progress Notes (Unsigned)
Name: Tracie Ross [facility]  MRN/ DOB: [id number], [birth year]    Age/ Sex: 62 y.o., female     PCP: Assunta Found, MD   Reason for Endocrinology Evaluation: Hypothyroidism      Initial Endocrinology Clinic Visit: 11/23/2018    PATIENT IDENTIFIER: Tracie Ross is a 62 y.o., female with a past medical history of Depression, chronic headaches. She has followed with [facility] since 11/23/2018 for consultative assistance with management of her hypothyroidism.   HISTORICAL SUMMARY: The patient was first diagnosed with hypothyroidism many years ago. Has been on Levothyroxine since her diagnosis. In 2016 she required Levothyroxine 125 mcg daily, but sometime in 2018 she started having extreme fatigue with hair loss and nail deformity and her dose has been gradually  reduced.    Started liothyronine 09/2021  SUBJECTIVE:    Today (10/22/2022):  [name] is here for a follow up on hypothyroidism. She is compliant with LT-4 replacement. She takes appropriately   Patient has a history of palpitations, she had an event monitor in 2018, which demonstrated paroxysms of sinus tachycardia, her last follow-up with cardiology was earlier of this year, she has a pending echocardiogram  She was diagnosed with vertigo and sinus infection in May She has been complaining of fatigue for the past 3 months as well as sleeping more than usual, in addition to abnormal sensation in her arm (feels water going down her arms with tingling) and cold intolerance  Takes linzess for chronic constipation   She tried B complex but that she did not like it  No biotin    Levothyroxine 50 mcg , daily except Sunday 1.5 tabs  Liothyronine 5 mcg daily  HISTORY:  Past Medical History:  Past Medical History:  Diagnosis Date   GERD (gastroesophageal reflux disease)    Hypothyroidism    Past Surgical History:  Past Surgical History:  Procedure Laterality Date   CERVICAL FUSION  08/07/2016   Dr.  Channing Mutters   [facility] SURGERY  1998   [facility]   TONSILLECTOMY     age 45   Social History:  reports that she has never smoked. She has never used smokeless tobacco. She reports that she does not drink alcohol and does not use drugs. Family History:  Family History  Problem Relation Age of Onset   COPD Mother    Dementia Mother    Cirrhosis Mother    Asthma Mother    Hypothyroidism Mother    Stroke Mother    Breast cancer Mother 54   Hypertension Father    Heart attack Father    Stroke Father    Breast cancer Sister      HOME MEDICATIONS: Allergies as of 10/22/2022       Reactions   Tape    Iohexol Itching, Rash   Desc: Patient began to itch after receiving omnipaque 300. Was given 25 mg benadryl and felt better., Onset Date: [id number]        Medication List        Accurate as of October 22, 2022  7:33 AM. If you have any questions, ask your nurse or doctor.          amitriptyline 50 MG tablet Commonly known as: ELAVIL Take 50 mg by mouth at bedtime.   cetirizine 10 MG tablet Commonly known as: ZyrTEC Allergy Take 1 tablet (10 mg total) by mouth daily.   eletriptan 20 MG tablet Commonly known as: RELPAX Take 20 mg by mouth as needed  for migraine or headache. May repeat in 2 hours if headache persists or recurs.   fluticasone 50 MCG/ACT nasal spray Commonly known as: FLONASE Place 1 spray into both nostrils 2 (two) times daily.   levothyroxine 50 MCG tablet Commonly known as: SYNTHROID TAKING 1 TABLET ON MONDAY TO SATURDAY. TAKING 1.5 TABLETS ON SUNDAY   linaclotide 290 MCG Caps capsule Commonly known as: LINZESS Take 290 mcg by mouth daily.   liothyronine 5 MCG tablet Commonly known as: CYTOMEL TAKE 1 TABLET BY MOUTH EVERY DAY   metoprolol succinate 25 MG 24 hr tablet Commonly known as: Toprol XL Take 1 tablet (25 mg total) by mouth daily.   topiramate 100 MG tablet Commonly known as: TOPAMAX Take 100 mg by mouth daily.   Vitamin D 50 MCG (2000 UT)  tablet Take 2,000 Units by mouth daily.          OBJECTIVE:   PHYSICAL EXAM: VS: There were no vitals taken for this visit.   EXAM: General: Pt appears well and is in NAD  Neck: General: Supple without adenopathy. Thyroid: No goiter or nodules appreciated.   Lungs: Clear with good BS bilat   Heart: Auscultation: RRR.  Abdomen:  soft, nontender  Extremities:  BL LE: No pretibial edema   Mental Status: Judgment, insight: Intact Orientation: Oriented to time, place, and person Mood and affect: No depression, anxiety, or agitation     DATA REVIEWED:  ****     ASSESSMENT / PLAN / RECOMMENDATIONS:   Hypothyroidism    -Patient with multiple nonspecific symptoms -She was started on liothyronine in 2023 due to fluctuation of her TSH, and I suspect that there may have been variable absorption of LT-4 replacement -She has chronic neck sensation    Medications   Levothyroxine 50 mcg , daily except sundays takes 1.5 tabs  Start liothyronine 5 mg daily    2. Hypervitaminosis  D :  -She has been off vitamin D ~ 2023 -Vitamin D is***    3. Fatigue:  -  F/U in 1 yr    Signed electronically by: Lyndle Herrlich, MD  [facility] End[facility]  [facility] [address] [place] [place], [place] [address] Phone: [phone] FAX: [phone]      CC: Assunta Found, MD [address] [place] [address] Phone: [phone]  Fax: [phone]   Return to Endocrinology clinic as below: Future Appointments  Date Time Provider Department Center  10/22/2022  1:00 PM Zyire Eidson, Konrad Dolores, MD [facility] None  10/27/2022  2:40 PM [facility] MM 2 [facility]  12/03/2022  1:40 PM Branch, Dorothe Pea, MD [facility]

## 2022-10-23 MED ORDER — LIOTHYRONINE SODIUM 5 MCG PO TABS: 5.0000 ug | ORAL_TABLET | Freq: Every day | ORAL | 3 refills | Status: DC

## 2022-10-23 MED ORDER — LEVOTHYROXINE SODIUM 50 MCG PO TABS: ORAL_TABLET | ORAL | 3 refills | Status: DC

## 2022-10-27 ENCOUNTER — Ambulatory Visit
Admission: RE | Admit: 2022-10-27 | Discharge: 2022-10-27 | Disposition: A | Discharge status: Home / Self Care | Payer: BC Managed Care – PPO | Source: Ambulatory Visit | Attending: Family Medicine | Admitting: Family Medicine

## 2022-10-27 DIAGNOSIS — Z0000 Encounter for general adult medical examination without abnormal findings: Secondary | ICD-10-CM

## 2022-12-03 ENCOUNTER — Ambulatory Visit: Payer: BC Managed Care – PPO | Admitting: Cardiology

## 2022-12-25 ENCOUNTER — Ambulatory Visit: Payer: BC Managed Care – PPO | Attending: Cardiology

## 2022-12-25 DIAGNOSIS — R0602 Shortness of breath: Secondary | ICD-10-CM | POA: Diagnosis not present

## 2022-12-26 LAB — ECHOCARDIOGRAM COMPLETE
AR max vel: 2.3 cm2
AV Area VTI: 2.02 cm2
AV Area mean vel: 2.36 cm2
AV Mean grad: 4 mmHg
AV Peak grad: 6.6 mmHg
Ao pk vel: 1.28 m/s
Area-P 1/2: 2.81 cm2
Calc EF: 63.4 %
MV VTI: 2.12 cm2
S' Lateral: 2.3 cm
Single Plane A2C EF: 59.5 %
Single Plane A4C EF: 68.3 %

## 2022-12-31 ENCOUNTER — Encounter: Payer: Self-pay | Admitting: *Deleted

## 2023-01-08 ENCOUNTER — Ambulatory Visit: Payer: BC Managed Care – PPO | Attending: Nurse Practitioner | Admitting: Nurse Practitioner

## 2023-01-08 ENCOUNTER — Encounter: Payer: Self-pay | Admitting: Nurse Practitioner

## 2023-01-08 VITALS — BP 110/80 | HR 78 | Ht 60.0 in | Wt 120.8 lb

## 2023-01-08 DIAGNOSIS — K2289 Other specified disease of esophagus: Secondary | ICD-10-CM

## 2023-01-08 DIAGNOSIS — Z87898 Personal history of other specified conditions: Secondary | ICD-10-CM

## 2023-01-08 DIAGNOSIS — R1319 Other dysphagia: Secondary | ICD-10-CM | POA: Diagnosis not present

## 2023-01-08 DIAGNOSIS — R5383 Other fatigue: Secondary | ICD-10-CM | POA: Diagnosis not present

## 2023-01-08 NOTE — Patient Instructions (Addendum)
Medication Instructions:  Your physician recommends that you continue on your current medications as directed. Please refer to the Current Medication list given to you today.  Labwork: Requesting from PCP  Testing/Procedures: None  Follow-Up: Your physician recommends that you schedule a follow-up appointment in: 1 Year   Any Other Special Instructions Will Be Listed Below (If Applicable).   If you need a refill on your cardiac medications before your next appointment, please call your pharmacy.

## 2023-01-08 NOTE — Progress Notes (Signed)
Cardiology Office Note:  .   Date:  01/08/2023  ID:  Ronnette Hila Morgano, DOB 1960/12/06, MRN [id number] PCP: Assunta Found, MD  [facility] Providers Cardiologist:  Dina Rich, MD    History of Present Illness: .   Aanaya Janiga Trela is a 62 y.o. female with a PMH of hypothyroidism, palpitations, tachycardia, history of chest pain, GERD, and history of DOE, who presents today for 80-month follow-up.  Last seen by Dr. Dina Rich in January 2024.  She noted some mild infrequent palpitations, or not severe per her report.  She noted some ongoing dyspnea on exertion.  There were no suspicions for ischemia at the time, it was felt to hold off on stress testing.  Echocardiogram was benign.   Today she presents for follow-up.  She is overall doing well.  Does say she is tired and sleepy at times, however she says she felt pretty energized today when she got ready for today's office visit.  Does have some difficulty swallowing and pain while eating certain foods -she says bread specifically seems to trigger this.  She has a history of having her esophagus stretched in the past.  She sees a gastroenterologist for this. Denies any recent chest pain, shortness of breath, palpitations, syncope, presyncope, dizziness, orthopnea, PND, swelling or significant weight changes, acute bleeding, or claudication.  ROS: See HPI. Negative.  Studies Reviewed: Marland Kitchen    EKG: EKG Interpretation Date/Time:  Thursday January 08 2023 08:32:18 EDT Ventricular Rate:  78 PR Interval:  118 QRS Duration:  78 QT Interval:  360 QTC Calculation: 410 R Axis:   12  Text Interpretation: Normal sinus rhythm Normal ECG No previous ECGs available Confirmed by Sharlene Dory [phone]) on 01/08/2023 8:44:10 AM   Echo 11/2022:   1. Left ventricular ejection fraction, by estimation, is 60 to 65%. The  left ventricle has normal function. Left ventricular endocardial border  not optimally defined to evaluate regional wall  motion. There is mild left  ventricular hypertrophy. Left  ventricular diastolic parameters are consistent with Grade I diastolic  dysfunction (impaired relaxation).   2. Right ventricular systolic function is normal. The right ventricular  size is normal. Tricuspid regurgitation signal is inadequate for assessing  PA pressure.   3. The mitral valve is grossly normal. Trivial mitral valve  regurgitation. No evidence of mitral stenosis.   4. The aortic valve was not well visualized. Aortic valve regurgitation  is not visualized. No aortic stenosis is present.   5. The inferior vena cava is normal in size with greater than 50%  respiratory variability, suggesting right atrial pressure of 3 mmHg.   Comparison(s): No prior Echocardiogram.  Cardiac monitor 01/2017: Patient unable to wear monitor for entire period of time. Paroxysms of sinus tachycardia seen with rates as high as 148 bpm.   Echo 12/2016: Study Conclusions   - Left ventricle: The cavity size was normal. Wall thickness was    normal. Systolic function was normal. The estimated ejection    fraction was in the range of 55% to 60%. Wall motion was normal;    there were no regional wall motion abnormalities. Left    ventricular diastolic function parameters were normal.  - Aortic valve: Mildly calcified annulus. Trileaflet.  - Mitral valve: There was trivial regurgitation.  - Right atrium: Central venous pressure (est): 3 mm Hg.  - Atrial septum: No defect or patent foramen ovale was identified.  - Tricuspid valve: There was trivial regurgitation.  - Pericardium, extracardiac: There  was no pericardial effusion.      Physical Exam:   VS:  BP 110/80   Pulse 78   Ht 5' (1.524 m)   Wt 120 lb 12.8 oz (54.8 kg)   SpO2 99%   BMI 23.59 kg/m    Wt Readings from Last 3 Encounters:  01/08/23 120 lb 12.8 oz (54.8 kg)  10/22/22 121 lb (54.9 kg)  05/26/22 117 lb (53.1 kg)    GEN: Well nourished, well developed in no acute  distress NECK: No JVD; No carotid bruits CARDIAC: S1/S2, RRR, no murmurs, rubs, gallops RESPIRATORY:  Clear to auscultation without rales, wheezing or rhonchi  ABDOMEN: Soft, non-tender, non-distended EXTREMITIES:  No edema; No deformity   ASSESSMENT AND PLAN: .    Hx of palpitations Denies any recent palpitations.  Continue Toprol-XL. Heart healthy diet and regular cardiovascular exercise encouraged. Continue to follow with PCP.  2. Fatigue Etiology multifactorial.  Recent labs reviewed from June 2024 that were within normal limits.  Low suspicion for OSA.  Recommended to follow-up with PCP regarding this.  3. Esophageal dysphagia /esophageal pain History of esophageal dilatation in the past-she currently sees a gastroenterologist.  Recommended follow-up with PCP and gastroenterology regarding this.  Her symptoms do not sound cardiac related. Continue to follow with PCP.  Dispo: She will fax over labs from PCP's office when these are available.  Follow-up in 1 year with Dr. Dina Rich or APP or sooner if anything changes.  Signed, Sharlene Dory, NP

## 2023-01-08 NOTE — Addendum Note (Signed)
Addended by: Sharlene Dory on: 01/08/2023 09:10 AM   Modules accepted: Level of Service

## 2023-08-21 ENCOUNTER — Telehealth: Payer: Self-pay | Admitting: Internal Medicine

## 2023-08-21 NOTE — Telephone Encounter (Signed)
 error

## 2023-08-24 ENCOUNTER — Encounter: Payer: Self-pay | Admitting: Internal Medicine

## 2023-08-24 ENCOUNTER — Ambulatory Visit (INDEPENDENT_AMBULATORY_CARE_PROVIDER_SITE_OTHER): Admitting: Internal Medicine

## 2023-08-24 VITALS — BP 110/72 | HR 75 | Ht 60.0 in | Wt 127.0 lb

## 2023-08-24 DIAGNOSIS — E559 Vitamin D deficiency, unspecified: Secondary | ICD-10-CM

## 2023-08-24 DIAGNOSIS — E039 Hypothyroidism, unspecified: Secondary | ICD-10-CM | POA: Diagnosis not present

## 2023-08-24 MED ORDER — LIOTHYRONINE SODIUM 5 MCG PO TABS: 5.0000 ug | ORAL_TABLET | Freq: Every day | ORAL | 3 refills | Status: DC

## 2023-08-24 MED ORDER — LEVOTHYROXINE SODIUM 75 MCG PO TABS: 75.0000 ug | ORAL_TABLET | Freq: Every day | ORAL | 3 refills | Status: DC

## 2023-08-24 NOTE — Progress Notes (Signed)
 Name: Tracie [facility]  MRN/ DOB: [id number], 1960/08/18    Age/ Sex: 63 y.o., female     PCP: Minus Amel, MD   Reason for Endocrinology Evaluation: Hypothyroidism      Initial Endocrinology Clinic Visit: 11/23/2018    PATIENT IDENTIFIER: [name] is a 63 y.o., female with a past medical history of Depression, chronic headaches. She has followed with [facility] since 11/23/2018 for consultative assistance with management of her hypothyroidism.   HISTORICAL SUMMARY: The patient was first diagnosed with hypothyroidism many years ago. Has been on Levothyroxine  since her diagnosis. In 2016 she required Levothyroxine  125 mcg daily, but sometime in 2018 she started having extreme fatigue with hair loss and nail deformity and her dose has been gradually  reduced.    Started liothyronine  09/2021  SUBJECTIVE:    Today (08/24/2023):  Tracie Ross is here for a follow up on hypothyroidism. She is compliant with LT-4 replacement. She takes appropriately   Patient has been noted weight gain Has noted left neck swelling, for unknown duration , she also has noted slight dysphagia, has history of esophageal dilatation Has noted neck skin itching  Continues with chronic fatigue  Has chronic sleep issues , doesn't snore Takes linzess for chronic constipation  No biotin    Levothyroxine  50 mcg , daily except Sunday 1.5 tabs  Liothyronine  5 mcg daily  HISTORY:  Past Medical History:  Past Medical History:  Diagnosis Date   GERD (gastroesophageal reflux disease)    Hypothyroidism    Past Surgical History:  Past Surgical History:  Procedure Laterality Date   CERVICAL FUSION  08/07/2016   Dr. Joanette Moynahan   [facility] SURGERY  1998   [facility]   TONSILLECTOMY     age 25   Social History:  reports that she has never smoked. She has never used smokeless tobacco. She reports that she does not drink alcohol and does not use drugs. Family History:  Family History  Problem Relation  Age of Onset   COPD Mother    Dementia Mother    Cirrhosis Mother    Asthma Mother    Hypothyroidism Mother    Stroke Mother    Breast cancer Mother 52   Hypertension Father    Heart attack Father    Stroke Father    Breast cancer Sister      HOME MEDICATIONS: Allergies as of 08/24/2023       Reactions   Tape    Iohexol Itching, Rash   Desc: Patient began to itch after receiving omnipaque 300. Was given 25 mg benadryl and felt better., Onset Date: [id number]        Medication List        Accurate as of August 24, 2023 10:51 AM. If you have any questions, ask your nurse or doctor.          amitriptyline 50 MG tablet Commonly known as: ELAVIL Take 50 mg by mouth at bedtime.   cetirizine  10 MG tablet Commonly known as: ZyrTEC  Allergy Take 1 tablet (10 mg total) by mouth daily.   eletriptan 20 MG tablet Commonly known as: RELPAX Take 20 mg by mouth as needed for migraine or headache. May repeat in 2 hours if headache persists or recurs.   escitalopram 10 MG tablet Commonly known as: LEXAPRO Take 10 mg by mouth daily.   levothyroxine  50 MCG tablet Commonly known as: SYNTHROID  1.5 tab on Sunday and 1 tab rest of the week  linaclotide 290 MCG Caps capsule Commonly known as: LINZESS Take 290 mcg by mouth daily.   liothyronine  5 MCG tablet Commonly known as: CYTOMEL  Take 5 mcg by mouth daily.   metoprolol  succinate 25 MG 24 hr tablet Commonly known as: Toprol  XL Take 1 tablet (25 mg total) by mouth daily.   topiramate 100 MG tablet Commonly known as: TOPAMAX Take 100 mg by mouth daily.   Vitamin D  (Ergocalciferol ) 1.25 MG (50000 UNIT) Caps capsule Commonly known as: DRISDOL Take 50,000 Units by mouth once a week.          OBJECTIVE:   PHYSICAL EXAM: VS: BP 110/72 (BP Location: Left Arm, Patient Position: Sitting, Cuff Size: Small)   Pulse 75   Ht 5' (1.524 m)   Wt 127 lb (57.6 kg)   SpO2 99%   BMI 24.80 kg/m    EXAM: General: Pt  appears well and is in NAD  Neck: General: Supple without adenopathy. Thyroid : No goiter or nodules appreciated.   Lungs: Clear with good BS bilat   Heart: Auscultation: RRR.  Abdomen:  soft, nontender  Extremities:  BL LE: No pretibial edema   Mental Status: Judgment, insight: Intact Mood and affect: No depression, anxiety, or agitation     DATA REVIEWED:  08/19/2023 TSH 8.24 u IU/mL Vitamin D  28.1  Old records , labs and images have been reviewed.   ASSESSMENT / PLAN / RECOMMENDATIONS:   Hypothyroidism    -Patient presented to her PCP with symptoms, TSH elevated at 8.24 u IU/mL -She was started on liothyronine  in 2023 due to fluctuation of her TSH, and I suspect that there may have been variable absorption of LT-4 replacement - TSH elevated, will increase levothyroxine  and recheck TFTs in 2 months  Medications  Stop levothyroxine  50 mcg , daily except sundays takes 1.5 tabs  Start levothyroxine  75 mcg daily Continue liothyronine  5 mg daily   2. Vitamin D  Insuffiencey :   - She has been off vitamin D  since 2023 due to hypervitaminosis D, her most recent vitamin D  levels through PCPs office were low at 28.1 ng/dL - I have recommended OTC vitamin D3 at 1000 international units daily  3.  Difficulty swallowing:  - This is mild, patient has history of esophageal dilatation, I did encourage the patient to follow-up with GI should her symptoms worsen -She did endorse swelling on the left side of the neck, thyroid  examination is normal, there is no evidence of salivary gland or lymphadenopathy on the left -Reassurance provided at this time    F/U in 4 months   Signed electronically by: Natale Bail, MD  [facility]  [facility] [address] [place] [place], [place] [address] Phone: [phone] FAX: [phone]      CC: Minus Amel, MD [address] [place] [address] Phone: [phone]  Fax:  [phone]   Return to Endocrinology clinic as below: No future appointments.

## 2023-08-24 NOTE — Patient Instructions (Signed)
 Stop levothyroxine  50 mcg dose Start levothyroxine  75 mcg daily Continue liothyronine  5 mcg daily Start over-the-counter vitamin D3 at 1000 international units daily

## 2023-09-14 ENCOUNTER — Other Ambulatory Visit: Payer: Self-pay | Admitting: Family Medicine

## 2023-09-14 DIAGNOSIS — Z1231 Encounter for screening mammogram for malignant neoplasm of breast: Secondary | ICD-10-CM

## 2023-10-26 ENCOUNTER — Other Ambulatory Visit

## 2023-10-26 LAB — T4, FREE: Free T4: 1.3 ng/dL (ref 0.8–1.8)

## 2023-10-26 LAB — TSH: TSH: 0.51 m[IU]/L (ref 0.40–4.50)

## 2023-10-26 LAB — VITAMIN D 25 HYDROXY (VIT D DEFICIENCY, FRACTURES): Vit D, 25-Hydroxy: 83 ng/mL (ref 30–100)

## 2023-10-27 ENCOUNTER — Ambulatory Visit: Payer: Self-pay | Admitting: Internal Medicine

## 2023-11-02 ENCOUNTER — Ambulatory Visit
Admission: RE | Admit: 2023-11-02 | Discharge: 2023-11-02 | Disposition: A | Discharge status: Home / Self Care | Source: Ambulatory Visit | Attending: Family Medicine | Admitting: Family Medicine

## 2023-11-02 ENCOUNTER — Ambulatory Visit: Payer: BC Managed Care – PPO | Admitting: Internal Medicine

## 2023-11-02 DIAGNOSIS — Z1231 Encounter for screening mammogram for malignant neoplasm of breast: Secondary | ICD-10-CM

## 2023-11-06 ENCOUNTER — Other Ambulatory Visit: Payer: Self-pay | Admitting: Internal Medicine

## 2023-12-09 NOTE — Progress Notes (Unsigned)
 New Patient Office Visit  Subjective   Patient ID: Tracie Ross, female    DOB: [birth year]  Age: 63 y.o. MRN: [id number]  CC: No chief complaint on file.   HPI Tracie Ross presents to establish care ***  Outpatient Encounter Medications as of 12/14/2023  Medication Sig   amitriptyline  (ELAVIL ) 50 MG tablet Take 50 mg by mouth at bedtime.   cetirizine  (ZYRTEC  ALLERGY) 10 MG tablet Take 1 tablet (10 mg total) by mouth daily.   eletriptan (RELPAX) 20 MG tablet Take 20 mg by mouth as needed for migraine or headache. May repeat in 2 hours if headache persists or recurs.   escitalopram  (LEXAPRO ) 10 MG tablet Take 10 mg by mouth daily.   levothyroxine  (SYNTHROID ) 75 MCG tablet Take 1 tablet (75 mcg total) by mouth daily.   linaclotide (LINZESS) 290 MCG CAPS capsule Take 290 mcg by mouth daily.   liothyronine  (CYTOMEL ) 5 MCG tablet Take 1 tablet (5 mcg total) by mouth daily.   metoprolol  succinate (TOPROL  XL) 25 MG 24 hr tablet Take 1 tablet (25 mg total) by mouth daily.   topiramate (TOPAMAX) 100 MG tablet Take 100 mg by mouth daily.   Vitamin D , Ergocalciferol , (DRISDOL) 1.25 MG (50000 UNIT) CAPS capsule Take 50,000 Units by mouth once a week.   No facility-administered encounter medications on file as of 12/14/2023.    Past Medical History:  Diagnosis Date   GERD (gastroesophageal reflux disease)    Hypothyroidism     Past Surgical History:  Procedure Laterality Date   CERVICAL FUSION  08/07/2016   Dr. Gaither   [facility] SURGERY  1998   [facility]   TONSILLECTOMY     age 35    Family History  Problem Relation Age of Onset   COPD Mother    Dementia Mother    Cirrhosis Mother    Asthma Mother    Hypothyroidism Mother    Stroke Mother    Breast cancer Mother 63   Hypertension Father    Heart attack Father    Stroke Father    Breast cancer Sister     Social History   Socioeconomic History   Marital status: Married    Spouse name: Not on file   Number of children: Not  on file   Years of education: Not on file   Highest education level: Not on file  Occupational History   Not on file  Tobacco Use   Smoking status: Never   Smokeless tobacco: Never  Substance and Sexual Activity   Alcohol use: Never    Alcohol/week: 0.0 standard drinks of alcohol   Drug use: Never   Sexual activity: Not on file  Other Topics Concern   Not on file  Social History Narrative   Not on file   Social Drivers of Health   Financial Resource Strain: Not on file  Food Insecurity: Not on file  Transportation Needs: Not on file  Physical Activity: Not on file  Stress: Not on file  Social Connections: Not on file  Intimate Partner Violence: Not on file    ROS Negative unless indicated in HPI    Objective   There were no vitals taken for this visit.  Physical Exam  Last CBC Lab Results  Component Value Date   WBC 9.3 10/22/2020   HGB 14.2 10/22/2020   HCT 42.8 10/22/2020   MCV 85.8 10/22/2020   RDW 13.9 10/22/2020   PLT 395.0 10/22/2020   Last metabolic panel  Lab Results  Component Value Date   GLUCOSE 93 10/22/2020   NA 140 10/22/2020   K 4.2 10/22/2020   CL 106 10/22/2020   CO2 26 10/22/2020   BUN 9 10/22/2020   CREATININE 0.90 10/22/2020   GFR 70.41 10/22/2020   CALCIUM 9.9 10/22/2020   PROT 7.4 10/22/2020   ALBUMIN 4.6 10/22/2020   BILITOT 0.5 10/22/2020   ALKPHOS 106 10/22/2020   AST 19 10/22/2020   ALT 11 10/22/2020    Last thyroid  functions Lab Results  Component Value Date   TSH 0.51 10/26/2023        Assessment & Plan:  There are no diagnoses linked to this encounter. Continue healthy lifestyle choices, including diet (rich in fruits, vegetables, and lean proteins, and low in salt and simple carbohydrates) and exercise (at least 30 minutes of moderate physical activity daily).     The above assessment and management plan was discussed with the patient. The patient verbalized understanding of and has agreed to the management  plan. Patient is aware to call the clinic if they develop any new symptoms or if symptoms persist or worsen. Patient is aware when to return to the clinic for a follow-up visit. Patient educated on when it is appropriate to go to the emergency department.  No follow-ups on file.   Zamere Pasternak [place] Thompson, DNP [facility] [address] [place], [place] [address] [phone]  Note: This document was prepared by [employer] voice dictation technology and any errors that results from this process are unintentional.

## 2023-12-14 ENCOUNTER — Ambulatory Visit: Payer: Self-pay | Admitting: Nurse Practitioner

## 2023-12-14 ENCOUNTER — Encounter: Payer: Self-pay | Admitting: Nurse Practitioner

## 2023-12-14 VITALS — BP 120/72 | HR 87 | Temp 97.2°F | Ht 61.0 in | Wt 126.8 lb

## 2023-12-14 DIAGNOSIS — R21 Rash and other nonspecific skin eruption: Secondary | ICD-10-CM

## 2023-12-14 DIAGNOSIS — E039 Hypothyroidism, unspecified: Secondary | ICD-10-CM

## 2023-12-14 DIAGNOSIS — Z0001 Encounter for general adult medical examination with abnormal findings: Secondary | ICD-10-CM | POA: Insufficient documentation

## 2023-12-14 DIAGNOSIS — R198 Other specified symptoms and signs involving the digestive system and abdomen: Secondary | ICD-10-CM | POA: Insufficient documentation

## 2023-12-14 DIAGNOSIS — K2100 Gastro-esophageal reflux disease with esophagitis, without bleeding: Secondary | ICD-10-CM | POA: Insufficient documentation

## 2023-12-14 DIAGNOSIS — K219 Gastro-esophageal reflux disease without esophagitis: Secondary | ICD-10-CM | POA: Insufficient documentation

## 2023-12-14 DIAGNOSIS — K529 Noninfective gastroenteritis and colitis, unspecified: Secondary | ICD-10-CM | POA: Insufficient documentation

## 2023-12-14 DIAGNOSIS — F324 Major depressive disorder, single episode, in partial remission: Secondary | ICD-10-CM

## 2023-12-14 DIAGNOSIS — K5900 Constipation, unspecified: Secondary | ICD-10-CM | POA: Insufficient documentation

## 2023-12-14 DIAGNOSIS — Z1211 Encounter for screening for malignant neoplasm of colon: Secondary | ICD-10-CM

## 2023-12-14 DIAGNOSIS — T7840XA Allergy, unspecified, initial encounter: Secondary | ICD-10-CM | POA: Insufficient documentation

## 2023-12-14 DIAGNOSIS — R000 Tachycardia, unspecified: Secondary | ICD-10-CM | POA: Diagnosis not present

## 2023-12-14 DIAGNOSIS — Z8639 Personal history of other endocrine, nutritional and metabolic disease: Secondary | ICD-10-CM | POA: Insufficient documentation

## 2023-12-14 DIAGNOSIS — K581 Irritable bowel syndrome with constipation: Secondary | ICD-10-CM | POA: Insufficient documentation

## 2023-12-14 DIAGNOSIS — K5904 Chronic idiopathic constipation: Secondary | ICD-10-CM | POA: Insufficient documentation

## 2023-12-14 LAB — LIPID PANEL

## 2023-12-14 MED ORDER — ESCITALOPRAM OXALATE 10 MG PO TABS: 10.0000 mg | ORAL_TABLET | Freq: Every day | ORAL | 0 refills | Status: DC

## 2023-12-14 MED ORDER — METOPROLOL SUCCINATE ER 25 MG PO TB24: 12.5000 mg | ORAL_TABLET | Freq: Every day | ORAL | 0 refills | Status: DC

## 2023-12-14 MED ORDER — VITAMIN D3 25 MCG (1000 UT) PO CAPS: 1000.0000 [IU] | ORAL_CAPSULE | Freq: Every day | ORAL | 0 refills | Status: AC

## 2023-12-14 MED ORDER — AMITRIPTYLINE HCL 25 MG PO TABS: 25.0000 mg | ORAL_TABLET | Freq: Every evening | ORAL | 0 refills | Status: DC | PRN

## 2023-12-14 MED ORDER — HYDROCORTISONE 1 % EX CREA: 1.0000 | TOPICAL_CREAM | Freq: Two times a day (BID) | CUTANEOUS | 0 refills | Status: AC

## 2023-12-15 ENCOUNTER — Ambulatory Visit: Payer: Self-pay | Admitting: Nurse Practitioner

## 2023-12-15 LAB — CBC WITH DIFFERENTIAL/PLATELET
Basophils Absolute: 0.1 x10E3/uL (ref 0.0–0.2)
Basos: 1 %
EOS (ABSOLUTE): 0.6 x10E3/uL — ABNORMAL HIGH (ref 0.0–0.4)
Eos: 6 %
Hematocrit: 42.9 % (ref 34.0–46.6)
Hemoglobin: 13.5 g/dL (ref 11.1–15.9)
Immature Grans (Abs): 0 x10E3/uL (ref 0.0–0.1)
Immature Granulocytes: 0 %
Lymphocytes Absolute: 2.3 x10E3/uL (ref 0.7–3.1)
Lymphs: 25 %
MCH: 28.3 pg (ref 26.6–33.0)
MCHC: 31.5 g/dL (ref 31.5–35.7)
MCV: 90 fL (ref 79–97)
Monocytes Absolute: 0.8 x10E3/uL (ref 0.1–0.9)
Monocytes: 9 %
Neutrophils Absolute: 5.3 x10E3/uL (ref 1.4–7.0)
Neutrophils: 59 %
Platelets: 335 x10E3/uL (ref 150–450)
RBC: 4.77 x10E6/uL (ref 3.77–5.28)
RDW: 12.8 % (ref 11.7–15.4)
WBC: 9 x10E3/uL (ref 3.4–10.8)

## 2023-12-15 LAB — HEPB+HEPC+HIV PANEL
HIV Screen 4th Generation wRfx: NONREACTIVE
Hep B E Ab: NONREACTIVE
Hep B Surface Ab, Qual: NONREACTIVE
Hep C Virus Ab: NONREACTIVE

## 2023-12-15 LAB — THYROID PANEL WITH TSH
Free Thyroxine Index: 2.8 (ref 1.2–4.9)
T3 Uptake Ratio: 29 (ref 24–39)
T4, Total: 9.5 ug/dL (ref 4.5–12.0)
TSH: 0.335 u[IU]/mL — AB (ref 0.450–4.500)

## 2023-12-15 LAB — LIPID PANEL
Cholesterol, Total: 159 mg/dL (ref 100–199)
HDL: 43 mg/dL (ref >39)
LDL CALC COMMENT:: 3.7 ratio (ref 0.0–4.4)
LDL Chol Calc (NIH): 98 mg/dL (ref 0–99)
Triglycerides: 99 mg/dL (ref 0–149)
VLDL Cholesterol Cal: 18 mg/dL (ref 5–40)

## 2023-12-15 LAB — CMP14+EGFR
ALT: 10 IU/L (ref 0–32)
AST: 19 IU/L (ref 0–40)
Albumin: 4.4 g/dL (ref 3.9–4.9)
Alkaline Phosphatase: 103 IU/L (ref 44–121)
BUN/Creatinine Ratio: 14 (ref 12–28)
BUN: 11 mg/dL (ref 8–27)
Bilirubin Total: 0.4 mg/dL (ref 0.0–1.2)
CO2: 21 mmol/L (ref 20–29)
Calcium: 9.7 mg/dL (ref 8.7–10.3)
Chloride: 106 mmol/L (ref 96–106)
Creatinine, Ser: 0.78 mg/dL (ref 0.57–1.00)
Globulin, Total: 2.4 g/dL (ref 1.5–4.5)
Glucose: 92 mg/dL (ref 70–99)
Potassium: 4.7 mmol/L (ref 3.5–5.2)
Sodium: 142 mmol/L (ref 134–144)
Total Protein: 6.8 g/dL (ref 6.0–8.5)
eGFR: 86 mL/min/1.73 (ref >59)

## 2023-12-25 ENCOUNTER — Ambulatory Visit (INDEPENDENT_AMBULATORY_CARE_PROVIDER_SITE_OTHER): Admitting: Internal Medicine

## 2023-12-25 ENCOUNTER — Encounter: Payer: Self-pay | Admitting: Internal Medicine

## 2023-12-25 VITALS — BP 120/84 | HR 89 | Ht 61.0 in | Wt 125.0 lb

## 2023-12-25 DIAGNOSIS — E039 Hypothyroidism, unspecified: Secondary | ICD-10-CM | POA: Diagnosis not present

## 2023-12-25 MED ORDER — LEVOTHYROXINE SODIUM 50 MCG PO TABS: 50.0000 ug | ORAL_TABLET | Freq: Every day | ORAL | 3 refills | Status: AC

## 2023-12-25 MED ORDER — LIOTHYRONINE SODIUM 5 MCG PO TABS: 5.0000 ug | ORAL_TABLET | Freq: Every day | ORAL | 3 refills | Status: DC

## 2023-12-25 NOTE — Progress Notes (Signed)
 Name: [facility]  MRN/ DOB: [id number], [birth year]    Age/ Sex: 63 y.o., female     PCP: Deitra Morton Sebastian Nena, NP   Reason for Endocrinology Evaluation: Hypothyroidism      Initial Endocrinology Clinic Visit: 11/23/2018    PATIENT IDENTIFIER: [name] is a 63 y.o., female with a past medical history of Depression, chronic headaches. She has followed with  Endocrinology clinic since 11/23/2018 for consultative assistance with management of her hypothyroidism.   HISTORICAL SUMMARY: The patient was first diagnosed with hypothyroidism many years ago. Has been on Levothyroxine  since her diagnosis. In 2016 she required Levothyroxine  125 mcg daily, but sometime in 2018 she started having extreme fatigue with hair loss and nail deformity and her dose has been gradually  reduced.    Started liothyronine  09/2021  SUBJECTIVE:    Today (12/25/2023):  Tracie Ross is here for a follow up on hypothyroidism. She is compliant with LT-4 replacement. She takes appropriately   Weight overall stable Denies local neck swelling  Chronic dysphagia is stable  Has chronic  fatigue but this has been fluctuating  Has chronic sleep issues  Takes linzess for chronic constipation  Has noted recent palpitations Patient is complaining of right arm pain, she does take care of her 62-year-old child, and tends to carry her on the right No biotin   Levothyroxine  75 mcg , 1 tablet daily  Liothyronine  5 mcg daily  HISTORY:  Past Medical History:  Past Medical History:  Diagnosis Date   GERD (gastroesophageal reflux disease)    Hypothyroidism    Past Surgical History:  Past Surgical History:  Procedure Laterality Date   CERVICAL FUSION  08/07/2016   Dr. Gaither   OVARY SURGERY  1998   [facility]   TONSILLECTOMY     age 55   Social History:  reports that she has never smoked. She has never used smokeless tobacco. She reports that she does not drink alcohol and does not use drugs. Family  History:  Family History  Problem Relation Age of Onset   COPD Mother    Dementia Mother    Cirrhosis Mother    Asthma Mother    Hypothyroidism Mother    Stroke Mother    Breast cancer Mother 25   Hypertension Father    Heart attack Father    Stroke Father    Breast cancer Sister      HOME MEDICATIONS: Allergies as of 12/25/2023       Reactions   Tape    Iohexol Itching, Rash   Desc: Patient began to itch after receiving omnipaque 300. Was given 25 mg benadryl and felt better., Onset Date: [id number]        Medication List        Accurate as of December 25, 2023  1:20 PM. If you have any questions, ask your nurse or doctor.          amitriptyline  25 MG tablet Commonly known as: ELAVIL  Take 1 tablet (25 mg total) by mouth at bedtime as needed for sleep.   cetirizine  10 MG tablet Commonly known as: ZyrTEC  Allergy Take 1 tablet (10 mg total) by mouth daily.   eletriptan 40 MG tablet Commonly known as: RELPAX Take 20 mg by mouth as needed for migraine.   escitalopram  10 MG tablet Commonly known as: LEXAPRO  Take 1 tablet (10 mg total) by mouth daily.   hydrocortisone  cream 1 % Apply 1 Application topically 2 (two) times daily.  levothyroxine  75 MCG tablet Commonly known as: SYNTHROID  Take 1 tablet (75 mcg total) by mouth daily.   linaclotide 290 MCG Caps capsule Commonly known as: LINZESS Take 290 mcg by mouth daily.   liothyronine  5 MCG tablet Commonly known as: CYTOMEL  Take 1 tablet (5 mcg total) by mouth daily.   metoprolol  succinate 25 MG 24 hr tablet Commonly known as: Toprol  XL Take 0.5 tablets (12.5 mg total) by mouth daily.   topiramate 100 MG tablet Commonly known as: TOPAMAX Take 100 mg by mouth daily.   Vitamin D3 25 MCG (1000 UT) Caps Take 1 capsule (1,000 Units total) by mouth daily.          OBJECTIVE:   PHYSICAL EXAM: VS: BP 120/84 (BP Location: Left Arm, Patient Position: Sitting, Cuff Size: Normal)   Pulse 89   Ht 5'  1 (1.549 m)   Wt 125 lb (56.7 kg)   SpO2 98%   BMI 23.62 kg/m   Filed Weights   12/25/23 1301  Weight: 125 lb (56.7 kg)    EXAM: General: Pt appears well and is in NAD  Neck: General: Supple without adenopathy. Thyroid : No goiter or nodules appreciated.   Lungs: Clear with good BS bilat   Heart: Auscultation: RRR.  Abdomen:  soft, nontender  Extremities:  BL LE: No pretibial edema   Mental Status: Judgment, insight: Intact Mood and affect: No depression, anxiety, or agitation     DATA REVIEWED:   Latest Reference Range & Units 12/14/23 11:57  Sodium 134 - 144 mmol/L 142  Potassium 3.5 - 5.2 mmol/L 4.7  Chloride 96 - 106 mmol/L 106  CO2 20 - 29 mmol/L 21  Glucose 70 - 99 mg/dL 92  BUN 8 - 27 mg/dL 11  Creatinine 9.42 - 8.99 mg/dL 9.21  Calcium 8.7 - 89.6 mg/dL 9.7  BUN/Creatinine Ratio 12 - 28  14  eGFR >59 mL/min/1.73 86  Alkaline Phosphatase 44 - 121 IU/L 103  Albumin 3.9 - 4.9 g/dL 4.4  AST 0 - 40 IU/L 19  ALT 0 - 32 IU/L 10  Total Protein 6.0 - 8.5 g/dL 6.8  Total Bilirubin 0.0 - 1.2 mg/dL 0.4    Latest Reference Range & Units 12/14/23 11:57  TSH 0.450 - 4.500 uIU/mL 0.335 (L)  Thyroxine (T4) 4.5 - 12.0 ug/dL 9.5  Free Thyroxine Index 1.2 - 4.9  2.8  T3 Uptake Ratio 24 - 39 % 29  (L): Data is abnormally low  Old records , labs and images have been reviewed.   ASSESSMENT / PLAN / RECOMMENDATIONS:   Hypothyroidism    - Patient continues to have fluctuating TSH - Most recent TSH is low as above, will decrease levothyroxine  - Will continue liothyronine   Medications  Stop levothyroxine  75 mcg daily Start levothyroxine  50 mcg daily Continue liothyronine  5 mg daily   2. Vitamin D  Insuffiencey :   - She has been off vitamin D  since 2023 due to hypervitaminosis D. -Patient was advised to restart vitamin D  1000 international units daily due to a low vitamin D  at 28.1 NG/DL   Continue OTC vitamin D3 at 1000 international units daily    F/U in 6  months Labs in 2 months  Signed electronically by: Stefano Redgie Butts, MD  [facility]  [facility] [address] [place] [place], [place] [address] Phone: [phone] FAX: [phone]      CC: Deitra Morton Sebastian Nena, NP [address] [place] [address] Phone: [phone]  Fax: [phone]   Return to Endocrinology clinic as below: Future Appointments  Date Time Provider Department Center  03/21/2024 10:30 AM [facility] Sebastian Pool, NP [id number] [phone] [address]

## 2023-12-25 NOTE — Patient Instructions (Signed)

## 2024-01-28 ENCOUNTER — Encounter: Payer: Self-pay | Admitting: Nurse Practitioner

## 2024-01-28 ENCOUNTER — Telehealth: Admitting: Nurse Practitioner

## 2024-01-28 ENCOUNTER — Telehealth: Payer: Self-pay

## 2024-01-28 DIAGNOSIS — J019 Acute sinusitis, unspecified: Secondary | ICD-10-CM | POA: Diagnosis not present

## 2024-01-28 DIAGNOSIS — B9789 Other viral agents as the cause of diseases classified elsewhere: Secondary | ICD-10-CM | POA: Diagnosis not present

## 2024-01-28 MED ORDER — AZELASTINE HCL 0.1 % NA SOLN: 1.0000 | Freq: Two times a day (BID) | NASAL | 0 refills | Status: DC

## 2024-01-28 NOTE — Progress Notes (Signed)
 Virtual Visit via video Note Due to COVID-19 pandemic this visit was conducted virtually. This visit type was conducted due to national recommendations for restrictions regarding the COVID-19 Pandemic (e.g. social distancing, sheltering in place) in an effort to limit this patient's exposure and mitigate transmission in our community. All issues noted in this document were discussed and addressed.  A physical exam was not performed with this format.   I connected with Tracie Ross on 01/28/2024 at 1100 by Name and DOB and verified that I am speaking with the correct person using two identifiers. Tracie Ross is currently located at [facility]  and sister is currently with them during visit. The provider, Nena Deitra Morton Sebastian, DNP is located in their office at time of visit.  I discussed the limitations, risks, security and privacy concerns of performing an evaluation and management service by virtual visit and the availability of in person appointments. I also discussed with the patient that there may be a patient responsible charge related to this service. The patient expressed understanding and agreed to proceed.  Subjective:  Patient ID: Tracie Ross, female    DOB: [birth year], 63 y.o.   MRN: [id number]  Chief Complaint:  Abdominal Pain (Sinus problem for 1-days clear nasal discharge) and Sinus Problem (Since yesterday, clear snout)   HPI: Tracie Ross is a 63 y.o. female presenting on 01/28/2024 for Abdominal Pain (Sinus problem for 1-days clear nasal discharge) and Sinus Problem (Since yesterday, clear snout)   Sinusitis:  Reports acute sinus infections for 1 day.  Her symptoms include nasal congestion, clear rhinorrhea.  There has not been a history of none of significance. There does not have been a history of chronic otitis media or pharyngotonsillitis. Has been using Flonase  with minimal releif     Relevant past medical, surgical, family, and social  history reviewed and updated as indicated.  Allergies and medications reviewed and updated.   Past Medical History:  Diagnosis Date   GERD (gastroesophageal reflux disease)    Hypothyroidism     Past Surgical History:  Procedure Laterality Date   CERVICAL FUSION  08/07/2016   Dr. Gaither   [facility] SURGERY  1998   [facility]   TONSILLECTOMY     age 61    Social History   Socioeconomic History   Marital status: Married    Spouse name: Not on file   Number of children: Not on file   Years of education: Not on file   Highest education level: Not on file  Occupational History   Not on file  Tobacco Use   Smoking status: Never   Smokeless tobacco: Never  Substance and Sexual Activity   Alcohol use: Never    Alcohol/week: 0.0 standard drinks of alcohol   Drug use: Never   Sexual activity: Not on file  Other Topics Concern   Not on file  Social History Narrative   Not on file   Social Drivers of Health   Financial Resource Strain: Low Risk  (12/14/2023)   Overall Financial Resource Strain (CARDIA)    Difficulty of Paying Living Expenses: Not very hard  Food Insecurity: Unknown (12/14/2023)   Hunger Vital Sign    Worried About Running Out of Food in the Last Year: Never true    Ran Out of Food in the Last Year: Not on file  Transportation Needs: Not on file  Physical Activity: Not on file  Stress: Not on file  Social Connections: Not on file  Intimate Partner Violence: Not At Risk (12/14/2023)   Humiliation, Afraid, Rape, and Kick questionnaire    Fear of Current or Ex-Partner: No    Emotionally Abused: No    Physically Abused: No    Sexually Abused: No    Outpatient Encounter Medications as of 01/28/2024  Medication Sig   azelastine (ASTELIN) 0.1 % nasal spray Place 1 spray into both nostrils 2 (two) times daily. Use in each nostril as directed   amitriptyline  (ELAVIL ) 25 MG tablet Take 1 tablet (25 mg total) by mouth at bedtime as needed for sleep.   cetirizine  (ZYRTEC   ALLERGY) 10 MG tablet Take 1 tablet (10 mg total) by mouth daily.   Cholecalciferol (VITAMIN D3) 25 MCG (1000 UT) CAPS Take 1 capsule (1,000 Units total) by mouth daily.   eletriptan (RELPAX) 40 MG tablet Take 20 mg by mouth as needed for migraine.   escitalopram  (LEXAPRO ) 10 MG tablet Take 1 tablet (10 mg total) by mouth daily.   hydrocortisone  cream 1 % Apply 1 Application topically 2 (two) times daily.   levothyroxine  (SYNTHROID ) 50 MCG tablet Take 1 tablet (50 mcg total) by mouth daily.   linaclotide (LINZESS) 290 MCG CAPS capsule Take 290 mcg by mouth daily.   liothyronine  (CYTOMEL ) 5 MCG tablet Take 1 tablet (5 mcg total) by mouth daily.   metoprolol  succinate (TOPROL  XL) 25 MG 24 hr tablet Take 0.5 tablets (12.5 mg total) by mouth daily.   topiramate (TOPAMAX) 100 MG tablet Take 100 mg by mouth daily.   No facility-administered encounter medications on file as of 01/28/2024.    Allergies  Allergen Reactions   Tape    Iohexol Itching and Rash    Desc: Patient began to itch after receiving omnipaque 300. Was given 25 mg benadryl and felt better., Onset Date: [id number]     Review of Systems  Constitutional:  Negative for appetite change, chills and fatigue.  HENT:  Positive for rhinorrhea and sneezing. Negative for congestion, ear pain, sinus pressure, sinus pain, sore throat, tinnitus and trouble swallowing.        Clear  Respiratory:  Negative for chest tightness, shortness of breath and wheezing.   Cardiovascular:  Negative for chest pain and leg swelling.  Gastrointestinal:  Negative for blood in stool, nausea and vomiting.  Skin:  Negative for color change.  Neurological:  Negative for dizziness and facial asymmetry.     Observations/Objective: No vital signs or physical exam, this was a virtual health encounter.  Pt alert and oriented, answers all questions appropriately, and able to speak in full sentences.   Physical Exam Constitutional:      General: She is not in  acute distress. HENT:     Head: Normocephalic and atraumatic.  Eyes:     Extraocular Movements: Extraocular movements intact.     Conjunctiva/sclera: Conjunctivae normal.     Pupils: Pupils are equal, round, and reactive to light.  Pulmonary:     Effort: Pulmonary effort is normal.  Neurological:     Mental Status: She is alert.  Psychiatric:        Mood and Affect: Mood normal.        Behavior: Behavior normal.        Thought Content: Thought content normal.        Judgment: Judgment normal.     Assessment and Plan: Tracie Ross was seen today for abdominal pain and sinus problem.  Diagnoses and all orders for this visit:  Acute viral sinusitis -  azelastine (ASTELIN) 0.1 % nasal spray; Place 1 spray into both nostrils 2 (two) times daily. Use in each nostril as directed   71 Caucasian female seen today for bilateral URI, no acute distress Continue Flonase  daily; add Astelin 1 spray each naris twice daily  - Client to call by Wednesday next week if symptom does not improve,  Follow Up Instructions: Return if symptoms worsen or fail to improve.    I discussed the assessment and treatment plan with the patient. The patient was provided an opportunity to ask questions and all were answered. The patient agreed with the plan and demonstrated an understanding of the instructions.   The patient was advised to call back or seek an in-person evaluation if the symptoms worsen or if the condition fails to improve as anticipated.  The above assessment and management plan was discussed with the patient. The patient verbalized understanding of and has agreed to the management plan. Patient is aware to call the clinic if they develop any new symptoms or if symptoms persist or worsen. Patient is aware when to return to the clinic for a follow-up visit. Patient educated on when it is appropriate to go to the emergency department.    I provided 12 minutes of time during this video  encounter.   Chellsea Beckers [place] Thompson, DNP [facility] [address] [place], [place] [address] [phone] 01/28/2024

## 2024-01-28 NOTE — Telephone Encounter (Signed)
 Copied from CRM #[id number]. Topic: Clinical - Medication Question >> Jan 28, 2024  8:56 AM Myrick T wrote: Reason for CRM: patient called to get medication for possible sinus infection. Sneezing, tender under her eyes, runny nose and pressure in her head. Please call patient on her cell#[phone]

## 2024-02-05 ENCOUNTER — Other Ambulatory Visit: Payer: Self-pay | Admitting: Nurse Practitioner

## 2024-02-08 NOTE — Telephone Encounter (Signed)
 Last OV 12/14/23. Last RF 12/14/23 #90 no RF . Next OV 03/21/24

## 2024-02-29 ENCOUNTER — Other Ambulatory Visit

## 2024-03-01 ENCOUNTER — Other Ambulatory Visit: Payer: Self-pay | Admitting: *Deleted

## 2024-03-01 ENCOUNTER — Other Ambulatory Visit

## 2024-03-01 DIAGNOSIS — B9789 Other viral agents as the cause of diseases classified elsewhere: Secondary | ICD-10-CM

## 2024-03-03 ENCOUNTER — Other Ambulatory Visit: Payer: Self-pay | Admitting: *Deleted

## 2024-03-03 DIAGNOSIS — F324 Major depressive disorder, single episode, in partial remission: Secondary | ICD-10-CM

## 2024-03-05 ENCOUNTER — Other Ambulatory Visit: Payer: Self-pay | Admitting: *Deleted

## 2024-03-05 DIAGNOSIS — R000 Tachycardia, unspecified: Secondary | ICD-10-CM

## 2024-03-07 ENCOUNTER — Other Ambulatory Visit

## 2024-03-07 LAB — TSH: TSH: 7.2 m[IU]/L — ABNORMAL HIGH (ref 0.40–4.50)

## 2024-03-07 LAB — T4, FREE: Free T4: 0.9 ng/dL (ref 0.8–1.8)

## 2024-03-09 ENCOUNTER — Ambulatory Visit: Payer: Self-pay | Admitting: Internal Medicine

## 2024-03-09 ENCOUNTER — Other Ambulatory Visit: Payer: Self-pay | Admitting: Internal Medicine

## 2024-03-09 DIAGNOSIS — E039 Hypothyroidism, unspecified: Secondary | ICD-10-CM

## 2024-03-09 MED ORDER — LIOTHYRONINE SODIUM 5 MCG PO TABS
5.0000 ug | ORAL_TABLET | Freq: Two times a day (BID) | ORAL | 3 refills | Status: AC
Start: 2024-03-09 — End: ?

## 2024-03-21 ENCOUNTER — Ambulatory Visit: Payer: Self-pay | Admitting: Nurse Practitioner

## 2024-05-09 ENCOUNTER — Other Ambulatory Visit

## 2024-05-16 ENCOUNTER — Ambulatory Visit: Admitting: Nurse Practitioner

## 2024-05-16 ENCOUNTER — Other Ambulatory Visit

## 2024-05-17 LAB — TSH: TSH: 0.69 m[IU]/L (ref 0.40–4.50)

## 2024-05-19 ENCOUNTER — Telehealth: Payer: Self-pay

## 2024-05-19 NOTE — Telephone Encounter (Signed)
 Patient aware to continue medication and follow up in march.

## 2024-05-19 NOTE — Telephone Encounter (Signed)
 Patient would like to know if she should still take cytomel  twice a day based on recent lab results.  Does she need to lab work in 8 weeks?

## 2024-05-28 ENCOUNTER — Other Ambulatory Visit: Payer: Self-pay | Admitting: Nurse Practitioner

## 2024-05-28 DIAGNOSIS — F324 Major depressive disorder, single episode, in partial remission: Secondary | ICD-10-CM

## 2024-06-02 ENCOUNTER — Ambulatory Visit: Admitting: Cardiology

## 2024-07-01 ENCOUNTER — Ambulatory Visit: Admitting: Internal Medicine

## 2024-08-22 ENCOUNTER — Encounter: Admitting: Family Medicine

## 2024-09-30 ENCOUNTER — Ambulatory Visit: Admitting: Nurse Practitioner
